# Patient Record
Sex: Male | Born: 1940 | Race: White | Hispanic: No | Marital: Single | Smoking: Former smoker
Health system: Southern US, Community
[De-identification: ages and names within clinical notes are randomized; demographics above are authoritative.]

## PROBLEM LIST (undated history)

## (undated) DIAGNOSIS — M24562 Contracture, left knee: Secondary | ICD-10-CM

## (undated) DIAGNOSIS — F32A Depression, unspecified: Secondary | ICD-10-CM

## (undated) DIAGNOSIS — Z89619 Acquired absence of unspecified leg above knee: Secondary | ICD-10-CM

## (undated) DIAGNOSIS — R1310 Dysphagia, unspecified: Secondary | ICD-10-CM

## (undated) DIAGNOSIS — I2510 Atherosclerotic heart disease of native coronary artery without angina pectoris: Secondary | ICD-10-CM

## (undated) DIAGNOSIS — J449 Chronic obstructive pulmonary disease, unspecified: Secondary | ICD-10-CM

## (undated) DIAGNOSIS — F329 Major depressive disorder, single episode, unspecified: Secondary | ICD-10-CM

## (undated) DIAGNOSIS — I10 Essential (primary) hypertension: Secondary | ICD-10-CM

## (undated) DIAGNOSIS — G8929 Other chronic pain: Secondary | ICD-10-CM

## (undated) HISTORY — PX: RENAL BIOPSY, PERCUTANEOUS: SUR144

## (undated) HISTORY — PX: AMPUTATION: SHX166

---

## 2003-01-21 ENCOUNTER — Other Ambulatory Visit: Payer: Self-pay

## 2003-01-27 ENCOUNTER — Other Ambulatory Visit: Payer: Self-pay

## 2003-05-03 ENCOUNTER — Other Ambulatory Visit: Payer: Self-pay

## 2003-09-29 ENCOUNTER — Other Ambulatory Visit: Payer: Self-pay

## 2003-10-12 ENCOUNTER — Other Ambulatory Visit: Payer: Self-pay

## 2003-12-13 ENCOUNTER — Emergency Department: Payer: Self-pay | Admitting: Emergency Medicine

## 2004-03-18 ENCOUNTER — Emergency Department: Payer: Self-pay | Admitting: Emergency Medicine

## 2004-05-15 ENCOUNTER — Ambulatory Visit: Payer: Self-pay | Admitting: Family Medicine

## 2004-05-15 ENCOUNTER — Inpatient Hospital Stay: Payer: Self-pay | Admitting: Internal Medicine

## 2004-06-10 ENCOUNTER — Inpatient Hospital Stay: Payer: Self-pay

## 2004-08-20 ENCOUNTER — Inpatient Hospital Stay: Payer: Self-pay | Admitting: Internal Medicine

## 2004-10-24 ENCOUNTER — Inpatient Hospital Stay: Payer: Self-pay | Admitting: Infectious Diseases

## 2004-10-26 ENCOUNTER — Other Ambulatory Visit: Payer: Self-pay

## 2004-12-15 ENCOUNTER — Emergency Department: Payer: Self-pay | Admitting: Emergency Medicine

## 2005-01-07 ENCOUNTER — Inpatient Hospital Stay: Payer: Self-pay | Admitting: Internal Medicine

## 2005-01-07 ENCOUNTER — Other Ambulatory Visit: Payer: Self-pay

## 2005-08-15 ENCOUNTER — Ambulatory Visit: Payer: Self-pay | Admitting: Ophthalmology

## 2005-11-21 ENCOUNTER — Ambulatory Visit: Payer: Self-pay | Admitting: Ophthalmology

## 2006-12-22 ENCOUNTER — Emergency Department: Payer: Self-pay | Admitting: Internal Medicine

## 2006-12-22 ENCOUNTER — Other Ambulatory Visit: Payer: Self-pay

## 2008-06-15 ENCOUNTER — Ambulatory Visit: Payer: Self-pay | Admitting: Internal Medicine

## 2008-07-13 ENCOUNTER — Ambulatory Visit: Payer: Self-pay | Admitting: Unknown Physician Specialty

## 2009-01-25 ENCOUNTER — Ambulatory Visit: Payer: Self-pay | Admitting: Internal Medicine

## 2009-09-28 ENCOUNTER — Emergency Department: Payer: Self-pay | Admitting: Emergency Medicine

## 2010-02-06 ENCOUNTER — Emergency Department: Payer: Self-pay | Admitting: Emergency Medicine

## 2010-03-18 ENCOUNTER — Inpatient Hospital Stay: Payer: Self-pay | Admitting: Vascular Surgery

## 2010-04-21 ENCOUNTER — Other Ambulatory Visit: Payer: Self-pay | Admitting: Geriatric Medicine

## 2011-01-19 ENCOUNTER — Ambulatory Visit: Payer: Self-pay | Admitting: Geriatric Medicine

## 2011-02-16 ENCOUNTER — Other Ambulatory Visit: Payer: Self-pay | Admitting: Geriatric Medicine

## 2011-03-02 ENCOUNTER — Other Ambulatory Visit: Payer: Self-pay | Admitting: Geriatric Medicine

## 2011-03-04 ENCOUNTER — Ambulatory Visit: Payer: Self-pay

## 2011-03-04 ENCOUNTER — Ambulatory Visit: Payer: Self-pay | Admitting: Geriatric Medicine

## 2011-03-09 ENCOUNTER — Emergency Department: Payer: Self-pay | Admitting: Unknown Physician Specialty

## 2011-03-09 LAB — CBC
HCT: 43.6 % (ref 40.0–52.0)
HGB: 14.5 g/dL (ref 13.0–18.0)
MCH: 29.8 pg (ref 26.0–34.0)
Platelet: 209 10*3/uL (ref 150–440)
RBC: 4.85 10*6/uL (ref 4.40–5.90)
RDW: 14.7 % — ABNORMAL HIGH (ref 11.5–14.5)

## 2011-03-09 LAB — COMPREHENSIVE METABOLIC PANEL
Albumin: 2.9 g/dL — ABNORMAL LOW (ref 3.4–5.0)
Alkaline Phosphatase: 55 U/L (ref 50–136)
Anion Gap: 8 (ref 7–16)
Calcium, Total: 8.7 mg/dL (ref 8.5–10.1)
Chloride: 105 mmol/L (ref 98–107)
Co2: 27 mmol/L (ref 21–32)
Creatinine: 0.86 mg/dL (ref 0.60–1.30)
EGFR (African American): >60
EGFR (Non-African Amer.): >60
Osmolality: 284 (ref 275–301)
Potassium: 4.7 mmol/L (ref 3.5–5.1)
Sodium: 140 mmol/L (ref 136–145)

## 2011-03-09 LAB — LIPASE, BLOOD: Lipase: 159 U/L (ref 73–393)

## 2011-03-09 LAB — TROPONIN I: Troponin-I: <0.02 ng/mL

## 2011-04-24 ENCOUNTER — Ambulatory Visit: Payer: Self-pay | Admitting: Gastroenterology

## 2011-05-03 ENCOUNTER — Ambulatory Visit: Payer: Self-pay | Admitting: Gastroenterology

## 2011-05-24 ENCOUNTER — Ambulatory Visit: Payer: Self-pay | Admitting: Gastroenterology

## 2011-05-28 LAB — PATHOLOGY REPORT

## 2011-10-02 ENCOUNTER — Ambulatory Visit: Payer: Self-pay | Admitting: Geriatric Medicine

## 2011-10-25 ENCOUNTER — Ambulatory Visit: Payer: Self-pay | Admitting: Urology

## 2011-11-01 ENCOUNTER — Ambulatory Visit: Payer: Self-pay | Admitting: Urology

## 2011-11-01 LAB — APTT: Activated PTT: 33.7 secs (ref 23.6–35.9)

## 2011-11-01 LAB — PROTIME-INR: INR: 0.9

## 2012-02-22 ENCOUNTER — Other Ambulatory Visit: Payer: Self-pay | Admitting: Pediatrics

## 2012-02-22 LAB — CBC WITH DIFFERENTIAL/PLATELET
Basophil #: 0.1 10*3/uL (ref 0.0–0.1)
Eosinophil #: 0.3 10*3/uL (ref 0.0–0.7)
Lymphocyte #: 1.6 10*3/uL (ref 1.0–3.6)
Lymphocyte %: 23.6 %
MCHC: 32.5 g/dL (ref 32.0–36.0)
MCV: 84 fL (ref 80–100)
Monocyte #: 0.9 x10 3/mm (ref 0.2–1.0)
Monocyte %: 13.1 %
Neutrophil #: 3.9 10*3/uL (ref 1.4–6.5)
Neutrophil %: 57.8 %
Platelet: 188 10*3/uL (ref 150–440)
RDW: 15.7 % — ABNORMAL HIGH (ref 11.5–14.5)
WBC: 6.8 10*3/uL (ref 3.8–10.6)

## 2012-02-22 LAB — BASIC METABOLIC PANEL
Anion Gap: 7 (ref 7–16)
Calcium, Total: 8.7 mg/dL (ref 8.5–10.1)
Chloride: 105 mmol/L (ref 98–107)
Co2: 26 mmol/L (ref 21–32)
EGFR (African American): >60
Glucose: 99 mg/dL (ref 65–99)
Sodium: 138 mmol/L (ref 136–145)

## 2012-04-08 ENCOUNTER — Ambulatory Visit: Payer: Self-pay | Admitting: Urology

## 2012-04-18 DIAGNOSIS — N138 Other obstructive and reflux uropathy: Secondary | ICD-10-CM | POA: Insufficient documentation

## 2012-04-18 DIAGNOSIS — N200 Calculus of kidney: Secondary | ICD-10-CM | POA: Insufficient documentation

## 2012-04-18 DIAGNOSIS — N281 Cyst of kidney, acquired: Secondary | ICD-10-CM | POA: Insufficient documentation

## 2012-04-18 DIAGNOSIS — N401 Enlarged prostate with lower urinary tract symptoms: Secondary | ICD-10-CM

## 2012-05-15 DIAGNOSIS — C649 Malignant neoplasm of unspecified kidney, except renal pelvis: Secondary | ICD-10-CM | POA: Insufficient documentation

## 2012-06-04 ENCOUNTER — Other Ambulatory Visit: Payer: Self-pay | Admitting: Family Medicine

## 2012-06-04 LAB — OCCULT BLOOD X 1 CARD TO LAB, STOOL: Occult Blood, Feces: NEGATIVE

## 2012-07-15 ENCOUNTER — Ambulatory Visit: Payer: Self-pay | Admitting: Urology

## 2012-07-15 LAB — BASIC METABOLIC PANEL
BUN: 16 mg/dL (ref 7–18)
Calcium, Total: 9.2 mg/dL (ref 8.5–10.1)
Creatinine: 0.87 mg/dL (ref 0.60–1.30)
EGFR (Non-African Amer.): >60
Glucose: 96 mg/dL (ref 65–99)
Potassium: 4.6 mmol/L (ref 3.5–5.1)

## 2012-07-15 LAB — CBC WITH DIFFERENTIAL/PLATELET
Basophil %: 0.3 %
Eosinophil #: 0.3 10*3/uL (ref 0.0–0.7)
Eosinophil %: 3.6 %
Lymphocyte %: 13.3 %
MCH: 26.6 pg (ref 26.0–34.0)
MCHC: 32.5 g/dL (ref 32.0–36.0)
MCV: 82 fL (ref 80–100)
Monocyte %: 11.8 %
Neutrophil #: 6.3 10*3/uL (ref 1.4–6.5)
RBC: 4.47 10*6/uL (ref 4.40–5.90)
RDW: 18.2 % — ABNORMAL HIGH (ref 11.5–14.5)
WBC: 8.9 10*3/uL (ref 3.8–10.6)

## 2012-07-29 ENCOUNTER — Ambulatory Visit: Payer: Self-pay | Admitting: Internal Medicine

## 2012-07-29 LAB — MAGNESIUM: Magnesium: 1.6 mg/dL — ABNORMAL LOW

## 2012-07-29 LAB — CBC WITH DIFFERENTIAL/PLATELET
Basophil #: 0.1 10*3/uL (ref 0.0–0.1)
Eosinophil %: 0.2 %
Lymphocyte #: 0.7 10*3/uL — ABNORMAL LOW (ref 1.0–3.6)
Lymphocyte %: 4 %
MCV: 82 fL (ref 80–100)
Monocyte #: 0.8 x10 3/mm (ref 0.2–1.0)
Monocyte %: 4.8 %
RBC: 4.5 10*6/uL (ref 4.40–5.90)

## 2012-07-29 LAB — BASIC METABOLIC PANEL
Calcium, Total: 8.8 mg/dL (ref 8.5–10.1)
Creatinine: 1.07 mg/dL (ref 0.60–1.30)
EGFR (African American): >60
Sodium: 132 mmol/L — ABNORMAL LOW (ref 136–145)

## 2012-07-30 LAB — CBC WITH DIFFERENTIAL/PLATELET
Basophil #: 0.1 10*3/uL (ref 0.0–0.1)
Basophil %: 0.6 %
Eosinophil %: 0.4 %
HGB: 10.5 g/dL — ABNORMAL LOW (ref 13.0–18.0)
Lymphocyte %: 11.8 %
MCH: 27.3 pg (ref 26.0–34.0)
MCHC: 33.4 g/dL (ref 32.0–36.0)
MCV: 82 fL (ref 80–100)
Monocyte #: 0.8 x10 3/mm (ref 0.2–1.0)
Monocyte %: 9.5 %
Neutrophil #: 6.6 10*3/uL — ABNORMAL HIGH (ref 1.4–6.5)
Platelet: 229 10*3/uL (ref 150–440)
RBC: 3.86 10*6/uL — ABNORMAL LOW (ref 4.40–5.90)

## 2012-07-30 LAB — BASIC METABOLIC PANEL
Anion Gap: 7 (ref 7–16)
BUN: 20 mg/dL — ABNORMAL HIGH (ref 7–18)
EGFR (Non-African Amer.): >60

## 2012-08-27 ENCOUNTER — Inpatient Hospital Stay: Payer: Self-pay | Admitting: Internal Medicine

## 2012-08-27 LAB — COMPREHENSIVE METABOLIC PANEL
Albumin: 3 g/dL — ABNORMAL LOW (ref 3.4–5.0)
Alkaline Phosphatase: 100 U/L (ref 50–136)
Anion Gap: 13 (ref 7–16)
BUN: 16 mg/dL (ref 7–18)
Bilirubin,Total: 0.5 mg/dL (ref 0.2–1.0)
Calcium, Total: 9.2 mg/dL (ref 8.5–10.1)
Chloride: 99 mmol/L (ref 98–107)
Co2: 24 mmol/L (ref 21–32)
EGFR (African American): 38 — ABNORMAL LOW
Osmolality: 273 (ref 275–301)
SGPT (ALT): 11 U/L — ABNORMAL LOW (ref 12–78)
Sodium: 136 mmol/L (ref 136–145)
Total Protein: 8 g/dL (ref 6.4–8.2)

## 2012-08-27 LAB — TROPONIN I: Troponin-I: <0.02 ng/mL

## 2012-08-27 LAB — CBC WITH DIFFERENTIAL/PLATELET
Basophil %: 0.8 %
Eosinophil %: 0.1 %
HCT: 38 % — ABNORMAL LOW (ref 40.0–52.0)
Lymphocyte #: 0.3 10*3/uL — ABNORMAL LOW (ref 1.0–3.6)
Lymphocyte %: 3 %
MCH: 28 pg (ref 26.0–34.0)
MCV: 85 fL (ref 80–100)
Monocyte #: 0.3 x10 3/mm (ref 0.2–1.0)
Monocyte %: 2.4 %
Neutrophil %: 93.7 %
WBC: 10.4 10*3/uL (ref 3.8–10.6)

## 2012-08-27 LAB — PRO B NATRIURETIC PEPTIDE: B-Type Natriuretic Peptide: 1499 pg/mL — ABNORMAL HIGH (ref 0–125)

## 2012-08-28 LAB — BASIC METABOLIC PANEL
Anion Gap: 11 (ref 7–16)
BUN: 28 mg/dL — ABNORMAL HIGH (ref 7–18)
Calcium, Total: 8.9 mg/dL (ref 8.5–10.1)
Chloride: 98 mmol/L (ref 98–107)
EGFR (African American): 47 — ABNORMAL LOW
Glucose: 101 mg/dL — ABNORMAL HIGH (ref 65–99)
Potassium: 5.3 mmol/L — ABNORMAL HIGH (ref 3.5–5.1)
Sodium: 132 mmol/L — ABNORMAL LOW (ref 136–145)

## 2012-08-29 LAB — BASIC METABOLIC PANEL
Anion Gap: 9 (ref 7–16)
BUN: 25 mg/dL — ABNORMAL HIGH (ref 7–18)
Calcium, Total: 8.6 mg/dL (ref 8.5–10.1)
Chloride: 105 mmol/L (ref 98–107)
Creatinine: 1.17 mg/dL (ref 0.60–1.30)
EGFR (Non-African Amer.): >60
Glucose: 202 mg/dL — ABNORMAL HIGH (ref 65–99)
Osmolality: 286 (ref 275–301)
Potassium: 3.4 mmol/L — ABNORMAL LOW (ref 3.5–5.1)

## 2012-08-30 LAB — BASIC METABOLIC PANEL
Anion Gap: 10 (ref 7–16)
Calcium, Total: 8.4 mg/dL — ABNORMAL LOW (ref 8.5–10.1)
Chloride: 102 mmol/L (ref 98–107)
Co2: 26 mmol/L (ref 21–32)
Creatinine: 1.12 mg/dL (ref 0.60–1.30)
EGFR (African American): >60
EGFR (Non-African Amer.): >60
Glucose: 120 mg/dL — ABNORMAL HIGH (ref 65–99)
Osmolality: 279 (ref 275–301)
Potassium: 3.3 mmol/L — ABNORMAL LOW (ref 3.5–5.1)
Sodium: 138 mmol/L (ref 136–145)

## 2012-08-30 LAB — MAGNESIUM: Magnesium: 1.5 mg/dL — ABNORMAL LOW

## 2012-09-02 LAB — CULTURE, BLOOD (SINGLE)

## 2012-12-31 ENCOUNTER — Other Ambulatory Visit: Payer: Self-pay | Admitting: Family Medicine

## 2012-12-31 LAB — BASIC METABOLIC PANEL
Anion Gap: 9 (ref 7–16)
Calcium, Total: 9.1 mg/dL (ref 8.5–10.1)
Chloride: 99 mmol/L (ref 98–107)
Co2: 25 mmol/L (ref 21–32)
EGFR (African American): 49 — ABNORMAL LOW
EGFR (Non-African Amer.): 42 — ABNORMAL LOW
Glucose: 143 mg/dL — ABNORMAL HIGH (ref 65–99)
Osmolality: 273 (ref 275–301)
Potassium: 4.6 mmol/L (ref 3.5–5.1)
Sodium: 133 mmol/L — ABNORMAL LOW (ref 136–145)

## 2012-12-31 LAB — CBC WITH DIFFERENTIAL/PLATELET
Basophil #: 0.1 10*3/uL (ref 0.0–0.1)
Eosinophil #: 0.1 10*3/uL (ref 0.0–0.7)
HGB: 11.3 g/dL — ABNORMAL LOW (ref 13.0–18.0)
Lymphocyte %: 5.6 %
MCH: 25.5 pg — ABNORMAL LOW (ref 26.0–34.0)
Monocyte #: 0.5 x10 3/mm (ref 0.2–1.0)
Monocyte %: 4 %
Neutrophil #: 10.2 10*3/uL — ABNORMAL HIGH (ref 1.4–6.5)
Neutrophil %: 89.5 %
RBC: 4.42 10*6/uL (ref 4.40–5.90)
WBC: 11.4 10*3/uL — ABNORMAL HIGH (ref 3.8–10.6)

## 2013-01-15 ENCOUNTER — Ambulatory Visit: Payer: Self-pay | Admitting: Family Medicine

## 2013-01-19 ENCOUNTER — Inpatient Hospital Stay: Payer: Self-pay | Admitting: Internal Medicine

## 2013-01-19 LAB — CK TOTAL AND CKMB (NOT AT ARMC): CK-MB: <0.5 ng/mL — ABNORMAL LOW (ref 0.5–3.6)

## 2013-01-19 LAB — CBC WITH DIFFERENTIAL/PLATELET
Basophil #: 0 10*3/uL (ref 0.0–0.1)
Basophil %: 0.1 %
Lymphocyte %: 6.5 %
MCH: 25.6 pg — ABNORMAL LOW (ref 26.0–34.0)
MCHC: 32.7 g/dL (ref 32.0–36.0)
MCV: 78 fL — ABNORMAL LOW (ref 80–100)
Monocyte %: 6.8 %
Neutrophil #: 9.5 10*3/uL — ABNORMAL HIGH (ref 1.4–6.5)
Neutrophil %: 86.3 %
Platelet: 117 10*3/uL — ABNORMAL LOW (ref 150–440)
RBC: 4.2 10*6/uL — ABNORMAL LOW (ref 4.40–5.90)
RDW: 19.9 % — ABNORMAL HIGH (ref 11.5–14.5)
WBC: 11 10*3/uL — ABNORMAL HIGH (ref 3.8–10.6)

## 2013-01-19 LAB — COMPREHENSIVE METABOLIC PANEL
Alkaline Phosphatase: 113 U/L (ref 50–136)
Anion Gap: 7 (ref 7–16)
Bilirubin,Total: 0.7 mg/dL (ref 0.2–1.0)
Chloride: 101 mmol/L (ref 98–107)
Co2: 24 mmol/L (ref 21–32)
Creatinine: 1.69 mg/dL — ABNORMAL HIGH (ref 0.60–1.30)
EGFR (African American): 47 — ABNORMAL LOW
SGOT(AST): 19 U/L (ref 15–37)
Sodium: 132 mmol/L — ABNORMAL LOW (ref 136–145)

## 2013-01-19 LAB — PRO B NATRIURETIC PEPTIDE: B-Type Natriuretic Peptide: 3905 pg/mL — ABNORMAL HIGH (ref 0–125)

## 2013-01-19 LAB — TROPONIN I: Troponin-I: <0.02 ng/mL

## 2013-01-20 LAB — CBC WITH DIFFERENTIAL/PLATELET
Basophil #: 0 10*3/uL (ref 0.0–0.1)
Basophil %: 0 %
Lymphocyte #: 0.4 10*3/uL — ABNORMAL LOW (ref 1.0–3.6)
MCH: 25.8 pg — ABNORMAL LOW (ref 26.0–34.0)
MCHC: 33.1 g/dL (ref 32.0–36.0)
Monocyte #: 0.2 x10 3/mm (ref 0.2–1.0)
Monocyte %: 2.6 %
Neutrophil #: 5.6 10*3/uL (ref 1.4–6.5)
Neutrophil %: 90.8 %
RDW: 19.8 % — ABNORMAL HIGH (ref 11.5–14.5)
WBC: 6.1 10*3/uL (ref 3.8–10.6)

## 2013-01-20 LAB — BASIC METABOLIC PANEL
Calcium, Total: 9.2 mg/dL (ref 8.5–10.1)
Chloride: 101 mmol/L (ref 98–107)
Co2: 24 mmol/L (ref 21–32)
EGFR (African American): 53 — ABNORMAL LOW
EGFR (Non-African Amer.): 46 — ABNORMAL LOW
Glucose: 137 mg/dL — ABNORMAL HIGH (ref 65–99)
Potassium: 4.3 mmol/L (ref 3.5–5.1)
Sodium: 132 mmol/L — ABNORMAL LOW (ref 136–145)

## 2013-02-12 ENCOUNTER — Ambulatory Visit: Payer: Self-pay | Admitting: Gastroenterology

## 2013-03-11 LAB — PATHOLOGY REPORT

## 2013-06-17 ENCOUNTER — Other Ambulatory Visit: Payer: Self-pay | Admitting: Family Medicine

## 2013-06-17 LAB — BASIC METABOLIC PANEL
Anion Gap: 7 (ref 7–16)
BUN: 17 mg/dL (ref 7–18)
CHLORIDE: 105 mmol/L (ref 98–107)
Calcium, Total: 8.7 mg/dL (ref 8.5–10.1)
Co2: 26 mmol/L (ref 21–32)
Creatinine: 0.88 mg/dL (ref 0.60–1.30)
EGFR (Non-African Amer.): >60
Glucose: 105 mg/dL — ABNORMAL HIGH (ref 65–99)
Osmolality: 278 (ref 275–301)
POTASSIUM: 4.4 mmol/L (ref 3.5–5.1)
SODIUM: 138 mmol/L (ref 136–145)

## 2013-06-17 LAB — CBC WITH DIFFERENTIAL/PLATELET
BASOS ABS: 0.1 10*3/uL (ref 0.0–0.1)
Basophil %: 0.9 %
EOS PCT: 3.2 %
Eosinophil #: 0.3 10*3/uL (ref 0.0–0.7)
HCT: 40.6 % (ref 40.0–52.0)
HGB: 12.5 g/dL — AB (ref 13.0–18.0)
LYMPHS ABS: 1.1 10*3/uL (ref 1.0–3.6)
Lymphocyte %: 11.7 %
MCH: 28.7 pg (ref 26.0–34.0)
MCHC: 30.8 g/dL — ABNORMAL LOW (ref 32.0–36.0)
MCV: 93 fL (ref 80–100)
MONO ABS: 0.7 x10 3/mm (ref 0.2–1.0)
MONOS PCT: 7.9 %
NEUTROS ABS: 7.1 10*3/uL — AB (ref 1.4–6.5)
NEUTROS PCT: 76.3 %
Platelet: 259 10*3/uL (ref 150–440)
RBC: 4.35 10*6/uL — ABNORMAL LOW (ref 4.40–5.90)
RDW: 15.1 % — ABNORMAL HIGH (ref 11.5–14.5)
WBC: 9.3 10*3/uL (ref 3.8–10.6)

## 2013-08-07 ENCOUNTER — Other Ambulatory Visit: Payer: Self-pay

## 2013-08-07 LAB — BASIC METABOLIC PANEL
ANION GAP: 7 (ref 7–16)
BUN: 24 mg/dL — AB (ref 7–18)
CALCIUM: 9 mg/dL (ref 8.5–10.1)
CHLORIDE: 104 mmol/L (ref 98–107)
Co2: 26 mmol/L (ref 21–32)
Creatinine: 1.26 mg/dL (ref 0.60–1.30)
EGFR (African American): >60
EGFR (Non-African Amer.): 57 — ABNORMAL LOW
Glucose: 100 mg/dL — ABNORMAL HIGH (ref 65–99)
Osmolality: 278 (ref 275–301)
Potassium: 4.5 mmol/L (ref 3.5–5.1)
SODIUM: 137 mmol/L (ref 136–145)

## 2013-08-07 LAB — CBC WITH DIFFERENTIAL/PLATELET
BASOS PCT: 1.3 %
Basophil #: 0.1 10*3/uL (ref 0.0–0.1)
EOS ABS: 0.3 10*3/uL (ref 0.0–0.7)
EOS PCT: 2.7 %
HCT: 40.4 % (ref 40.0–52.0)
HGB: 13.3 g/dL (ref 13.0–18.0)
Lymphocyte #: 1.3 10*3/uL (ref 1.0–3.6)
Lymphocyte %: 13.6 %
MCH: 31 pg (ref 26.0–34.0)
MCHC: 33 g/dL (ref 32.0–36.0)
MCV: 94 fL (ref 80–100)
MONOS PCT: 6.4 %
Monocyte #: 0.6 x10 3/mm (ref 0.2–1.0)
NEUTROS ABS: 7.4 10*3/uL — AB (ref 1.4–6.5)
Neutrophil %: 76 %
PLATELETS: 228 10*3/uL (ref 150–440)
RBC: 4.3 10*6/uL — AB (ref 4.40–5.90)
RDW: 15.2 % — ABNORMAL HIGH (ref 11.5–14.5)
WBC: 9.7 10*3/uL (ref 3.8–10.6)

## 2013-08-21 ENCOUNTER — Other Ambulatory Visit: Payer: Self-pay

## 2013-08-21 LAB — CBC WITH DIFFERENTIAL/PLATELET
Basophil #: 0.1 10*3/uL (ref 0.0–0.1)
Basophil %: 1 %
EOS ABS: 0.3 10*3/uL (ref 0.0–0.7)
EOS PCT: 3.4 %
HCT: 39.4 % — ABNORMAL LOW (ref 40.0–52.0)
HGB: 13 g/dL (ref 13.0–18.0)
LYMPHS ABS: 1.1 10*3/uL (ref 1.0–3.6)
LYMPHS PCT: 12.6 %
MCH: 31.1 pg (ref 26.0–34.0)
MCHC: 33.1 g/dL (ref 32.0–36.0)
MCV: 94 fL (ref 80–100)
Monocyte #: 0.6 x10 3/mm (ref 0.2–1.0)
Monocyte %: 6.2 %
Neutrophil #: 6.9 10*3/uL — ABNORMAL HIGH (ref 1.4–6.5)
Neutrophil %: 76.8 %
Platelet: 262 10*3/uL (ref 150–440)
RBC: 4.19 10*6/uL — AB (ref 4.40–5.90)
RDW: 15.5 % — ABNORMAL HIGH (ref 11.5–14.5)
WBC: 9 10*3/uL (ref 3.8–10.6)

## 2013-08-21 LAB — BASIC METABOLIC PANEL
Anion Gap: 8 (ref 7–16)
BUN: 22 mg/dL — AB (ref 7–18)
Calcium, Total: 9 mg/dL (ref 8.5–10.1)
Chloride: 102 mmol/L (ref 98–107)
Co2: 25 mmol/L (ref 21–32)
Creatinine: 1.03 mg/dL (ref 0.60–1.30)
EGFR (African American): >60
EGFR (Non-African Amer.): >60
Glucose: 142 mg/dL — ABNORMAL HIGH (ref 65–99)
OSMOLALITY: 276 (ref 275–301)
Potassium: 4.7 mmol/L (ref 3.5–5.1)
Sodium: 135 mmol/L — ABNORMAL LOW (ref 136–145)

## 2013-12-09 ENCOUNTER — Ambulatory Visit: Payer: Self-pay | Admitting: Family Medicine

## 2013-12-09 LAB — CBC WITH DIFFERENTIAL/PLATELET
BASOS ABS: 0 10*3/uL (ref 0.0–0.1)
Basophil %: 0.3 %
Eosinophil #: 0 10*3/uL (ref 0.0–0.7)
Eosinophil %: 0.2 %
HCT: 37 % — ABNORMAL LOW (ref 40.0–52.0)
HGB: 11.6 g/dL — ABNORMAL LOW (ref 13.0–18.0)
LYMPHS ABS: 0.6 10*3/uL — AB (ref 1.0–3.6)
Lymphocyte %: 4.3 %
MCH: 29.5 pg (ref 26.0–34.0)
MCHC: 31.4 g/dL — ABNORMAL LOW (ref 32.0–36.0)
MCV: 94 fL (ref 80–100)
MONO ABS: 0.7 x10 3/mm (ref 0.2–1.0)
Monocyte %: 5.4 %
NEUTROS ABS: 12 10*3/uL — AB (ref 1.4–6.5)
NEUTROS PCT: 89.8 %
PLATELETS: 224 10*3/uL (ref 150–440)
RBC: 3.93 10*6/uL — ABNORMAL LOW (ref 4.40–5.90)
RDW: 16.8 % — AB (ref 11.5–14.5)
WBC: 13.4 10*3/uL — ABNORMAL HIGH (ref 3.8–10.6)

## 2013-12-09 LAB — BASIC METABOLIC PANEL
Anion Gap: 10 (ref 7–16)
BUN: 29 mg/dL — AB (ref 7–18)
CALCIUM: 8.7 mg/dL (ref 8.5–10.1)
CHLORIDE: 103 mmol/L (ref 98–107)
CREATININE: 1.49 mg/dL — AB (ref 0.60–1.30)
Co2: 25 mmol/L (ref 21–32)
GFR CALC NON AF AMER: 50 — AB
Glucose: 78 mg/dL (ref 65–99)
Osmolality: 280 (ref 275–301)
Potassium: 4.9 mmol/L (ref 3.5–5.1)
SODIUM: 138 mmol/L (ref 136–145)

## 2014-02-19 ENCOUNTER — Ambulatory Visit: Payer: Self-pay

## 2014-02-19 LAB — CBC WITH DIFFERENTIAL/PLATELET
BASOS PCT: 1.1 %
Basophil #: 0.1 10*3/uL (ref 0.0–0.1)
Eosinophil #: 0.2 10*3/uL (ref 0.0–0.7)
Eosinophil %: 2.2 %
HCT: 33.3 % — ABNORMAL LOW (ref 40.0–52.0)
HGB: 10.6 g/dL — AB (ref 13.0–18.0)
LYMPHS ABS: 2.4 10*3/uL (ref 1.0–3.6)
LYMPHS PCT: 22.8 %
MCH: 28.5 pg (ref 26.0–34.0)
MCHC: 31.7 g/dL — AB (ref 32.0–36.0)
MCV: 90 fL (ref 80–100)
MONO ABS: 0.3 x10 3/mm (ref 0.2–1.0)
Monocyte %: 3 %
Neutrophil #: 7.6 10*3/uL — ABNORMAL HIGH (ref 1.4–6.5)
Neutrophil %: 70.9 %
Platelet: 263 10*3/uL (ref 150–440)
RBC: 3.7 10*6/uL — AB (ref 4.40–5.90)
RDW: 16.8 % — ABNORMAL HIGH (ref 11.5–14.5)
WBC: 10.7 10*3/uL — ABNORMAL HIGH (ref 3.8–10.6)

## 2014-02-22 ENCOUNTER — Ambulatory Visit: Payer: Self-pay

## 2014-02-22 ENCOUNTER — Inpatient Hospital Stay: Payer: Self-pay | Admitting: Surgery

## 2014-02-22 LAB — BASIC METABOLIC PANEL
ANION GAP: 10 (ref 7–16)
BUN: 18 mg/dL (ref 7–18)
Calcium, Total: 7.9 mg/dL — ABNORMAL LOW (ref 8.5–10.1)
Chloride: 99 mmol/L (ref 98–107)
Co2: 25 mmol/L (ref 21–32)
Creatinine: 0.97 mg/dL (ref 0.60–1.30)
EGFR (African American): >60
Glucose: 109 mg/dL — ABNORMAL HIGH (ref 65–99)
Osmolality: 271 (ref 275–301)
Potassium: 4.2 mmol/L (ref 3.5–5.1)
SODIUM: 134 mmol/L — AB (ref 136–145)

## 2014-02-22 LAB — URINALYSIS, COMPLETE
Bacteria: NONE SEEN
Bilirubin,UR: NEGATIVE
Blood: NEGATIVE
GLUCOSE, UR: NEGATIVE mg/dL (ref 0–75)
KETONE: NEGATIVE
Leukocyte Esterase: NEGATIVE
Nitrite: NEGATIVE
Ph: 5 (ref 4.5–8.0)
Protein: 30
Specific Gravity: 1.026 (ref 1.003–1.030)
Squamous Epithelial: <1

## 2014-02-22 LAB — HEPATIC FUNCTION PANEL A (ARMC)
ALK PHOS: 102 U/L
Albumin: 2 g/dL — ABNORMAL LOW (ref 3.4–5.0)
BILIRUBIN TOTAL: 0.5 mg/dL (ref 0.2–1.0)
Bilirubin, Direct: 0.1 mg/dL (ref 0.0–0.2)
SGOT(AST): 11 U/L — ABNORMAL LOW (ref 15–37)
SGPT (ALT): 11 U/L — ABNORMAL LOW
Total Protein: 6.8 g/dL (ref 6.4–8.2)

## 2014-02-22 LAB — CBC WITH DIFFERENTIAL/PLATELET
BASOS ABS: 0.1 10*3/uL (ref 0.0–0.1)
Basophil %: 1.3 %
EOS ABS: 0 10*3/uL (ref 0.0–0.7)
EOS PCT: 0.4 %
HCT: 30 % — ABNORMAL LOW (ref 40.0–52.0)
HGB: 9.8 g/dL — ABNORMAL LOW (ref 13.0–18.0)
LYMPHS PCT: 9.4 %
Lymphocyte #: 0.9 10*3/uL — ABNORMAL LOW (ref 1.0–3.6)
MCH: 28.6 pg (ref 26.0–34.0)
MCHC: 32.7 g/dL (ref 32.0–36.0)
MCV: 88 fL (ref 80–100)
MONO ABS: 0.8 x10 3/mm (ref 0.2–1.0)
MONOS PCT: 7.9 %
NEUTROS ABS: 7.8 10*3/uL — AB (ref 1.4–6.5)
NEUTROS PCT: 81 %
Platelet: 288 10*3/uL (ref 150–440)
RBC: 3.43 10*6/uL — ABNORMAL LOW (ref 4.40–5.90)
RDW: 17.1 % — ABNORMAL HIGH (ref 11.5–14.5)
WBC: 9.7 10*3/uL (ref 3.8–10.6)

## 2014-02-22 LAB — AMYLASE: AMYLASE: 23 U/L — AB (ref 25–115)

## 2014-02-22 LAB — LIPASE, BLOOD: Lipase: 71 U/L — ABNORMAL LOW (ref 73–393)

## 2014-02-23 LAB — CBC WITH DIFFERENTIAL/PLATELET
BASOS ABS: 0.1 10*3/uL (ref 0.0–0.1)
BASOS PCT: 1 %
EOS PCT: 1.1 %
Eosinophil #: 0.1 10*3/uL (ref 0.0–0.7)
HCT: 28.8 % — ABNORMAL LOW (ref 40.0–52.0)
HGB: 9.3 g/dL — ABNORMAL LOW (ref 13.0–18.0)
LYMPHS ABS: 0.8 10*3/uL — AB (ref 1.0–3.6)
Lymphocyte %: 9.6 %
MCH: 28.7 pg (ref 26.0–34.0)
MCHC: 32.5 g/dL (ref 32.0–36.0)
MCV: 88 fL (ref 80–100)
Monocyte #: 0.8 x10 3/mm (ref 0.2–1.0)
Monocyte %: 9.3 %
NEUTROS ABS: 6.7 10*3/uL — AB (ref 1.4–6.5)
Neutrophil %: 79 %
PLATELETS: 294 10*3/uL (ref 150–440)
RBC: 3.26 10*6/uL — ABNORMAL LOW (ref 4.40–5.90)
RDW: 17.1 % — ABNORMAL HIGH (ref 11.5–14.5)
WBC: 8.5 10*3/uL (ref 3.8–10.6)

## 2014-02-23 LAB — BASIC METABOLIC PANEL
Anion Gap: 9 (ref 7–16)
BUN: 15 mg/dL (ref 7–18)
CALCIUM: 8.4 mg/dL — AB (ref 8.5–10.1)
CO2: 24 mmol/L (ref 21–32)
Chloride: 97 mmol/L — ABNORMAL LOW (ref 98–107)
Creatinine: 0.98 mg/dL (ref 0.60–1.30)
Glucose: 75 mg/dL (ref 65–99)
Osmolality: 260 (ref 275–301)
POTASSIUM: 4 mmol/L (ref 3.5–5.1)
Sodium: 130 mmol/L — ABNORMAL LOW (ref 136–145)

## 2014-02-23 LAB — TSH: Thyroid Stimulating Horm: 2.78 u[IU]/mL

## 2014-02-23 LAB — HEPATIC FUNCTION PANEL A (ARMC)
ALT: 9 U/L — AB
Albumin: 1.8 g/dL — ABNORMAL LOW (ref 3.4–5.0)
Alkaline Phosphatase: 101 U/L
BILIRUBIN DIRECT: 0.2 mg/dL (ref 0.0–0.2)
Bilirubin,Total: 0.5 mg/dL (ref 0.2–1.0)
SGOT(AST): 22 U/L (ref 15–37)
Total Protein: 6.8 g/dL (ref 6.4–8.2)

## 2014-02-23 LAB — T4, FREE: Free Thyroxine: 1.5 ng/dL — ABNORMAL HIGH (ref 0.76–1.46)

## 2014-02-24 LAB — CBC WITH DIFFERENTIAL/PLATELET
Basophil #: 0.1 10*3/uL (ref 0.0–0.1)
Basophil %: 0.8 %
EOS ABS: 0 10*3/uL (ref 0.0–0.7)
Eosinophil %: 0.6 %
HCT: 29.3 % — AB (ref 40.0–52.0)
HGB: 9.3 g/dL — AB (ref 13.0–18.0)
LYMPHS PCT: 6.5 %
Lymphocyte #: 0.5 10*3/uL — ABNORMAL LOW (ref 1.0–3.6)
MCH: 27.9 pg (ref 26.0–34.0)
MCHC: 31.7 g/dL — AB (ref 32.0–36.0)
MCV: 88 fL (ref 80–100)
MONO ABS: 0.7 x10 3/mm (ref 0.2–1.0)
Monocyte %: 9.7 %
Neutrophil #: 6.2 10*3/uL (ref 1.4–6.5)
Neutrophil %: 82.4 %
PLATELETS: 292 10*3/uL (ref 150–440)
RBC: 3.33 10*6/uL — ABNORMAL LOW (ref 4.40–5.90)
RDW: 17.2 % — ABNORMAL HIGH (ref 11.5–14.5)
WBC: 7.6 10*3/uL (ref 3.8–10.6)

## 2014-02-24 LAB — BASIC METABOLIC PANEL
ANION GAP: 12 (ref 7–16)
BUN: 9 mg/dL (ref 7–18)
CALCIUM: 8.3 mg/dL — AB (ref 8.5–10.1)
CHLORIDE: 105 mmol/L (ref 98–107)
CO2: 16 mmol/L — AB (ref 21–32)
CREATININE: 1.03 mg/dL (ref 0.60–1.30)
EGFR (Non-African Amer.): >60
GLUCOSE: 91 mg/dL (ref 65–99)
Osmolality: 265 (ref 275–301)
Potassium: 4.2 mmol/L (ref 3.5–5.1)
Sodium: 133 mmol/L — ABNORMAL LOW (ref 136–145)

## 2014-02-24 LAB — URINE CULTURE

## 2014-02-25 LAB — URINALYSIS, COMPLETE
Bilirubin,UR: NEGATIVE
Glucose,UR: NEGATIVE mg/dL (ref 0–75)
Ketone: NEGATIVE
LEUKOCYTE ESTERASE: NEGATIVE
Nitrite: NEGATIVE
PH: 6 (ref 4.5–8.0)
Protein: 25
RBC,UR: 27 /HPF (ref 0–5)
Specific Gravity: 1.03 (ref 1.003–1.030)
Squamous Epithelial: 1
WBC UR: 3 /HPF (ref 0–5)

## 2014-02-26 LAB — BASIC METABOLIC PANEL
Anion Gap: 9 (ref 7–16)
BUN: 13 mg/dL (ref 7–18)
CHLORIDE: 110 mmol/L — AB (ref 98–107)
CO2: 22 mmol/L (ref 21–32)
CREATININE: 0.99 mg/dL (ref 0.60–1.30)
Calcium, Total: 8.5 mg/dL (ref 8.5–10.1)
EGFR (Non-African Amer.): >60
GLUCOSE: 132 mg/dL — AB (ref 65–99)
OSMOLALITY: 283 (ref 275–301)
POTASSIUM: 4.9 mmol/L (ref 3.5–5.1)
Sodium: 141 mmol/L (ref 136–145)

## 2014-02-26 LAB — CBC WITH DIFFERENTIAL/PLATELET
Basophil #: 0 10*3/uL (ref 0.0–0.1)
Basophil %: 0.2 %
EOS PCT: 0 %
Eosinophil #: 0 10*3/uL (ref 0.0–0.7)
HCT: 24.4 % — AB (ref 40.0–52.0)
HGB: 7.8 g/dL — ABNORMAL LOW (ref 13.0–18.0)
Lymphocyte #: 0.4 10*3/uL — ABNORMAL LOW (ref 1.0–3.6)
Lymphocyte %: 3.9 %
MCH: 28.6 pg (ref 26.0–34.0)
MCHC: 31.9 g/dL — AB (ref 32.0–36.0)
MCV: 90 fL (ref 80–100)
Monocyte #: 0.5 x10 3/mm (ref 0.2–1.0)
Monocyte %: 5.6 %
Neutrophil #: 8.5 10*3/uL — ABNORMAL HIGH (ref 1.4–6.5)
Neutrophil %: 90.3 %
Platelet: 314 10*3/uL (ref 150–440)
RBC: 2.72 10*6/uL — ABNORMAL LOW (ref 4.40–5.90)
RDW: 17.3 % — ABNORMAL HIGH (ref 11.5–14.5)
WBC: 9.4 10*3/uL (ref 3.8–10.6)

## 2014-02-27 LAB — BASIC METABOLIC PANEL
ANION GAP: 7 (ref 7–16)
BUN: 18 mg/dL (ref 7–18)
CALCIUM: 8 mg/dL — AB (ref 8.5–10.1)
Chloride: 107 mmol/L (ref 98–107)
Co2: 25 mmol/L (ref 21–32)
Creatinine: 0.95 mg/dL (ref 0.60–1.30)
EGFR (Non-African Amer.): >60
GLUCOSE: 117 mg/dL — AB (ref 65–99)
Osmolality: 280 (ref 275–301)
POTASSIUM: 4.3 mmol/L (ref 3.5–5.1)
SODIUM: 139 mmol/L (ref 136–145)

## 2014-02-27 LAB — CBC WITH DIFFERENTIAL/PLATELET
Basophil #: 0 10*3/uL (ref 0.0–0.1)
Basophil %: 0.1 %
EOS PCT: 0.1 %
Eosinophil #: 0 10*3/uL (ref 0.0–0.7)
HCT: 27.6 % — AB (ref 40.0–52.0)
HGB: 8.6 g/dL — ABNORMAL LOW (ref 13.0–18.0)
Lymphocyte #: 0.3 10*3/uL — ABNORMAL LOW (ref 1.0–3.6)
Lymphocyte %: 5.6 %
MCH: 27.9 pg (ref 26.0–34.0)
MCHC: 31.1 g/dL — ABNORMAL LOW (ref 32.0–36.0)
MCV: 90 fL (ref 80–100)
MONO ABS: 0.4 x10 3/mm (ref 0.2–1.0)
Monocyte %: 6.2 %
Neutrophil #: 5 10*3/uL (ref 1.4–6.5)
Neutrophil %: 88 %
Platelet: 347 10*3/uL (ref 150–440)
RBC: 3.07 10*6/uL — AB (ref 4.40–5.90)
RDW: 17.2 % — AB (ref 11.5–14.5)
WBC: 5.7 10*3/uL (ref 3.8–10.6)

## 2014-02-27 LAB — CULTURE, BLOOD (SINGLE)

## 2014-02-28 LAB — BASIC METABOLIC PANEL
ANION GAP: 8 (ref 7–16)
BUN: 19 mg/dL — ABNORMAL HIGH (ref 7–18)
CALCIUM: 8.6 mg/dL (ref 8.5–10.1)
Chloride: 100 mmol/L (ref 98–107)
Co2: 29 mmol/L (ref 21–32)
Creatinine: 0.96 mg/dL (ref 0.60–1.30)
EGFR (African American): >60
GLUCOSE: 120 mg/dL — AB (ref 65–99)
Osmolality: 277 (ref 275–301)
Potassium: 3.4 mmol/L — ABNORMAL LOW (ref 3.5–5.1)
Sodium: 137 mmol/L (ref 136–145)

## 2014-02-28 LAB — CBC WITH DIFFERENTIAL/PLATELET
BASOS ABS: 0 10*3/uL (ref 0.0–0.1)
BASOS PCT: 0.1 %
EOS ABS: 0 10*3/uL (ref 0.0–0.7)
EOS PCT: 0 %
HCT: 27.5 % — ABNORMAL LOW (ref 40.0–52.0)
HGB: 8.7 g/dL — ABNORMAL LOW (ref 13.0–18.0)
LYMPHS ABS: 0.3 10*3/uL — AB (ref 1.0–3.6)
LYMPHS PCT: 6.8 %
MCH: 27.7 pg (ref 26.0–34.0)
MCHC: 31.8 g/dL — AB (ref 32.0–36.0)
MCV: 87 fL (ref 80–100)
Monocyte #: 0.2 x10 3/mm (ref 0.2–1.0)
Monocyte %: 6.1 %
NEUTROS ABS: 3.5 10*3/uL (ref 1.4–6.5)
NEUTROS PCT: 87 %
PLATELETS: 388 10*3/uL (ref 150–440)
RBC: 3.15 10*6/uL — ABNORMAL LOW (ref 4.40–5.90)
RDW: 16.9 % — ABNORMAL HIGH (ref 11.5–14.5)
WBC: 4 10*3/uL (ref 3.8–10.6)

## 2014-06-25 NOTE — Discharge Summary (Signed)
PATIENT NAME:  Brent Ryan, Brent Ryan MR#:  [id number] DATE OF BIRTH:  [birth year]  DATE OF ADMISSION:  07/29/2012 DATE OF DISCHARGE:  07/30/2012   PRIMARY CARE PHYSICIAN:  Dr. Dan Humphreys  DISCHARGE DIAGNOSES: 1.  Acute respiratory failure.  2.  Chronic obstructive pulmonary disease  3 . Hypertension.  4.  Hypomagnesemia.  5.  Renal carcinoma.  PROCEDURES:  Cryotherapy.  CONDITION: Stable.   CODE STATUS: Full code.   MEDICATIONS:  As per the [facility] discharge instruction medication reconciliation list. Continue home medication.   DIET: Low sodium, low fat, low cholesterol diet.   ACTIVITY: As tolerated.   FOLLOW UP CARE:  Follow with PCP within 1 to 2 weeks. Follow up with Dr. Jacqlyn Larsen, urologist in one week.   REASON FOR ADMISSION: Respiratory distress after surgery.   HOSPITAL COURSE: The patient is a 74 year old Caucasian male with a history of COPD, CVA, seizure, hypertension and recently diagnosed with renal carcinoma and yesterday underwent cryotherapy under anesthesia. The patient was intubated during the procedure but after extubation, the patient developed respiratory distress and failure and reintubated again. Dr. Jacqlyn Larsen requested a consultation for respiratory failure. For detailed history and physical examination, please refer to the admission note dictated by me yesterday.  The patient was admitted for respiratory failure. The patient was successfully extubated 2 hours after the second intubation, but the patient is to have respiratory distress with oxygen by Ventimask. I discussed with Dr. Jacqlyn Larsen and we decided to admit the patient for observation. After admission, the patient has been treated with oxygen by nasal cannula with DuoNeb and Spiriva.  The patient's  respiratory failure had improved. The patient's oxygen saturation is above 92 without oxygen.   For hypertension the patient has been treated with Norvasc, Lopressor and aspirin.   The patient is clinically stable and will  be discharged back to a skilled nursing facility today. I discussed the patient's discharge plan with the patient, the case manager and nurse.   TIME SPENT: About sent from 76 minutes.     ____________________________ Demetrios Loll, MD qc:ct D: 07/30/2012 14:01:48 ET T: 07/30/2012 14:55:53 ET JOB#: [id number]  cc: Demetrios Loll, MD, <Dictator> Demetrios Loll MD ELECTRONICALLY SIGNED 07/30/2012 20:16

## 2014-06-25 NOTE — Discharge Summary (Signed)
PATIENT NAME:  Brent Ryan, Brent Ryan MR#:  [id number] DATE OF BIRTH:  07/30/1940  DATE OF ADMISSION:  08/27/2012 DATE OF DISCHARGE:  08/30/2012   PRESENTING COMPLAINT: Shortness of breath, cough.   DISCHARGE DIAGNOSES:  1. Acute hypoxic respiratory failure secondary to chronic obstructive pulmonary disease exacerbation in the setting of right lower lobe pneumonia and mild congestive heart failure.  2. Acute congestive heart failure, suspect diastolic, in the setting of pneumonia and chronic obstructive pulmonary disease exacerbation.  3. Acute renal failure, appeared prerenal azotemia, improved.  4. Relative hypotension, resolved.  5. History of hyperthyroidism.  6. History of deep vein thrombosis.  7. Left renal carcinoma.  8. 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 The patient is bedbound and wheelchair bound.   CODE STATUS: Full code.   DIET: 2 grams sodium, mechanical soft with pureed meats.   OXYGEN: Wean oxygen to room when sats greater than 92%. DuoNebs q.4 hours p.r.n.   MEDICATIONS:  1. Tylenol 650 q.4 p.r.n.  2. Colace 100 mg b.i.d. p.r.n.  3. Senokot 1 tablet p.o. b.i.d. p.r.n.  4. Aspirin 81 mg daily.  5. Norco 5/325 one tablet q.8 p.r.n.  6. Depakote extended release 250 mg b.i.d. with meals.  7. Methimazole 7.5 mg q.24.  8. Flomax 0.4 mg daily.  9. Zoloft 25 mg daily.  10. Lopressor 25 mg b.i.d.  11. Spiriva 1 capsule inhalation daily.  12. Ferrous sulfate 325 mg p.o. daily.  13. Omeprazole 20 mg daily.  14. Flovent HFA 1 inhalation b.i.d.  15. Guaifenesin 5 mL q.6 p.r.n. for cough.  16. Lasix 20 mg daily.  17. Amlodipine 2.5 mg daily.  18. Magnesium oxide 400 mg daily.  19. Levaquin 750 p.o. daily, to complete a 7-day course for 3 more days.  20. Albuterol 2 puffs q.4 while awake.  21. Vitamin D2 50,000 units monthly.  22. Prednisone taper as directed.   CONSULTATIONS: None.   LABORATORY DATA: At discharge, glucose is 120, BUN is 20,  creatinine is 1.1, sodium is 138, potassium 3.3, chloride 102, bicarbonate is 26, calcium is 8.4, magnesium 1.5. Blood cultures negative in 48 hours. White count is 10.4, H and H 12.6 and 38.8. Echo Doppler showed EF of 55% to 60%. Normal global left ventricular systolic function. Severely increased left ventricular posterior wall thickness. Mild MR.  BRIEF SUMMARY OF HOSPITAL COURSE: Brent Ryan is a 74 year old Caucasian gentleman who is a resident at [facility], who was admitted on June 25th with:  1. Acute hypoxic respiratory failure secondary to COPD exacerbation with right lower lobe pneumonia and mild congestive heart failure, suspect diastolic, acute. The patient was admitted on the telemetry floor, was started on IV antibiotics with Zosyn and Levaquin. He was also given nebulizers or inhalers, IV Solu-Medrol around the clock. The patient is improved, much better. He is still on oxygen, requiring about 2 to 3 liters. He has come down from 6 liters to 2 liters. Slowly wean it off at the skilled facility if sats remain greater than 92%. The patient's blood cultures remain negative. He remains afebrile.  2. Acute congestive heart failure, suspect diastolic in the setting of pneumonia and COPD exacerbation. The patient appears euvolemic. His Lasix has been resumed.  3. Relative hypotension, improved after IV fluids. Home medications, Norvasc and Lasix, have been resumed.  4. Acute renal failure, appeared prerenal azotemia, improved with some IV fluids. Creatinine is back to normal.  5. History of hyperthyroidism. Continue methimazole. 6. History of deep  vein thrombosis in the past. Heparin was given for subcutaneous prophylaxis. 7. Left renal carcinoma. The patient recently had percutaneous cryotherapy per Dr. Jacqlyn Ryan. Will continue his Flomax. The patient will follow up with Dr. Jacqlyn Ryan as outpatient.  8. The patient is wheelchair- and bedbound due to his right above-knee amputation.  [facility] stay  otherwise remained uncomplicated.   CODE STATUS: The patient remained a full code.   TIME SPENT: 40 minutes.   ____________________________ Brent Rochester Posey Pronto, MD sap:OSi D: 08/30/2012 09:44:58 ET T: 08/30/2012 09:56:50 ET JOB#: [id number]  cc: Brent Guldin A. Posey Pronto, MD, <Dictator> Brent Bors. Brent Larsen, MD Brent Basset MD ELECTRONICALLY SIGNED 09/18/2012 7:30

## 2014-06-25 NOTE — H&P (Signed)
PATIENT NAME:  Brent Ryan, Brent Ryan MR#:  [id number] DATE OF BIRTH:  August 19, 1940  DATE OF ADMISSION:  07/29/2012  PRIMARY CARE PHYSICIAN: Dr. Ivin Booty.   REFERRING PHYSICIAN:  Dr. Jacqlyn Larsen.  CHIEF COMPLAINT: Respiratory distress after surgery today.  HISTORY OF PRESENT ILLNESS: A 74 year old Caucasian male with a history of COPD, CVA, seizures, hypertension, was recently diagnosed with renal carcinoma, status cryotherapy today. The patient was under anesthesia, was intubated during the procedure. He developed respiratory failure after extubation today. His O2 saturations decreased to the 70-80s, so he was re-intubated again about 9:00 a.m. today. After breathing treatments, the patient was extubated again just now. After extubation the patient was placed on a Venti-Mask, still disoriented, possibilly due to sedation, and Dr. Jacqlyn Larsen requested a consult. I discussed him with Dr. Jacqlyn Larsen, and then we decided to admit the patient for observation.  PAST MEDICAL HISTORY: COPD, seizure disorder, hypertension, alcohol abuse, tobacco abuse, DVT, status post IVC filter, PVD, status post right BKA, right eye blindness.   FAMILY HISTORY: Unable to obtain at this time.   REVIEW OF SYSTEMS: The patient is sedated and unable to obtain at this time.    ALLERGIES: No.  MEDICATIONS:   1.  Norvasc 2.5 mg p.o. daily.  2.  Aspirin 81 mg p.o. daily.  3.  Delsym 12 hours, cough, with 10 mL b.i.d., p.r.n.   4.  Depakote ER 250 mg p.o. b.i.d.  5.  Drisdol 50,000 international units 1.25 mg capsule once a month.  6.  DuoNeb 1 dose every 6 hours p.r.n.  7.  Ferrous sulfate 325 mg p.o. daily.  8.  Flovent HFA, CFC-free, 44 mcg inhalation, 1 puff b.i.d.  9.  Methimazole 7.5 mg p.o. daily.  10.  Lopressor 25 mg p.o. b.i.d.  11. Mintox 200 mg/200 mg/20 mg per 5 mL oral suspension, 15 mL p.o. every 4 hours p.r.n. for indigestion.  12.  Omeprazole 20 mg p.o. daily.  13.  ProAir HFA 2 puffs 4 times a day.  14.  Senna S 50 mg/8.6 mg  p.o. at bedtime, p.r.n.  15.  Spiriva 18 mcg capsule 1 each inhaled once a day.  16.  Tylenol 500 mg with 2 tablets b.i.d.  17.  Zoloft 25 mg p.o. daily.  PHYSICAL EXAMINATION: VITAL SIGNS: Blood pressure 142/107, pulse 67, oxygen saturation 97% on oxygen, respirations 23.  GENERAL: The patient sedated, but in no acute distress.  HEENT: Pupils round, equal and reactive to light and accommodation.  NECK: Supple. No JVD or carotid bruits. No lymphadenopathy. No thyromegaly. No discharge from ears or nose. Dry oral mucosa.  CARDIOVASCULAR: S1, S2. Regular rate and rhythm. No murmurs or gallops.  PULMONARY:  Bilateral air entry. No wheezing. No rales. No use of accessory muscles to breathe.  ABDOMEN: Obese, soft. No distention or tenderness. No hepatosplenomegaly or organomegaly.  EXTREMITIES: No edema, clubbing, or cyanosis. Right-sided BKA. Left pedal pulses present.  SKIN: No rash or jaundice.  NEUROLOGY: Unable to do at this time due to patient's sedated status.   LABORATORY DATA: ABG showed pH of 7.39, PCO2 of 34, PO2 of 113; ABGs on ventilation. Chest x-ray shows mild opacity likely secondary to atelectasis. Endotracheal tube terminates approximately 1 cm above the carina. This x-ray was before extubation.  Telemonitor showed normal sinus rhythm in the 67 bpm. IMPRESSIONS: 1.  Acute respiratory failure. 2.  Chronic obstructive pulmonary disease. 3.  Renal carcinoma. 4.  Hypertension.  5.  History of a cerebrovascular accident.  6.  Peripheral vascular disease.  7.  Seizure.  8.  History of alcohol abuse.   PLAN OF TREATMENT:  1.  The patient will be placed for observation. Will continue O2 by nasal cannula and nebulizer, DuoNebs q.4 hours, and we will continue Spiriva.  2.  For hypertension, continue Norvasc and Lopressor. Continue aspirin.  3. GI and DVT prophylaxis.   Discussed the patient's condition and the plan of treatment with Dr. Jacqlyn Larsen and the anesthesiologist.  TIME  SPENT: About 65 minutes.    ____________________________ Demetrios Loll, MD qc:dm D: 07/29/2012 12:32:00 ET T: 07/29/2012 13:43:14 ET JOB#: [id number]  cc: Demetrios Loll, MD, <Dictator> Demetrios Loll MD ELECTRONICALLY SIGNED 07/29/2012 17:11

## 2014-06-25 NOTE — H&P (Signed)
PATIENT NAME:  Brent Ryan, Brent Ryan MR#:  [id number] DATE OF BIRTH:  05-12-1940  DATE OF ADMISSION:  08/27/2012  PRIMARY CARE PHYSICIAN: Brent Cookey A. Henry-Smith, MD  RESIDENCE: The patient is from [facility].  CHIEF COMPLAINT: Shortness of breath, cough with productive phlegm.   HISTORY OF PRESENT ILLNESS: Brent Ryan is a 74 year old Caucasian gentleman with multiple medical problems, including hypertension, history of seizure disorder, stroke, peripheral vascular disease with above-knee amputation on the right and history of COPD with tobacco abuse in the past, who comes in with increasing shortness of breath and productive phlegm/cough for a couple days at the skilled facility. He received Decadron at the nursing home and was started on IM Rocephin treatment. The patient continued to be short of breath, was found to be hypoxic with sats in the 70s when he arrived to the Emergency Room. He was placed on BiPAP. He also received a breathing treatment. He continues to have some wheezing. The patient remained on BiPAP a couple hours, and currently, his sats are 92% to 93% on 5 liters nasal cannula. He is feeling better with shortness of breath, although some audible wheezing is present. He is being admitted for acute on chronic hypoxic respiratory failure with right lower lobe pneumonia and possible mild congestive heart failure.   PAST MEDICAL HISTORY:  1. Alcohol abuse in the past.  2. DVT status post IVC filter placement.  3. Hypertension.  4. Seizure disorder.  5. CVA/stroke.  6. Above-knee amputation secondary to right lower extremity gangrene, this was done in January 2012.  7. History of hyperthyroidism.  8. COPD.  9. History of hepatic encephalopathy in the past.  10. Left renal cell carcinoma, follows up with Brent Ryan.   PAST SURGICAL HISTORY:  1. Right above-knee amputation.  2. Left percutaneous renal cell carcinoma cryotherapy in May 2014.   ALLERGIES: No known drug allergies.    CURRENT MEDICATIONS:  1. Aspirin 81 mg p.o. daily.  2. Decadron 4 mg intramuscular once was given on 25th of June at the nursing home.  3. Delsym 10 mL b.i.d. p.r.n.  4. Depakote 250 mg extended release 1 tablet b.i.d.  5. Drisdol 50,000 international units which is 1.25 mg orally once a month.  6. DuoNebs 3 mL every 6 hours as needed.  7. Ferrous sulfate 325 mg p.o. daily.  8. Flomax 0.4 mg daily.  9. Flovent HFA 1 puff b.i.d. daily.  10. Lidocaine topical 2% gel with applicator to apply topically to catheter once as needed.  11. Methimazole 5 mg 1-1/2 tablets that is 7.5 mg p.o. daily.  12. Metoprolol 25 mg b.i.d.  13. Mintox oral suspension 15 mL every 4 hours as needed for indigestion.  14. Norco 325/5 mg 1 tablet every 8 hours as needed.  15. Norvasc 2.5 mg daily.  16. Omeprazole 20 mg p.o. daily.  17. ProAir 2 puffs 4 times a day.  18. Pyridium 200 mg 1 tablet b.i.d.   19. Rocephin 1 gram dose was given at the nursing home.  20. Senokot-S 1 tablet daily as needed.  21. Spiriva 18 mcg inhalation daily.  22. Tylenol 2 tablets 2 times a day.  23. Zoloft 25 mg p.o. daily.   FAMILY HISTORY: Significant for mother having pancreatic cancer. Father died of lung cancer. This is obtained from old records.   REVIEW OF SYSTEMS:  CONSTITUTIONAL: No fever. Positive for fatigue, weakness.  EYES: No blurred or double vision. No glaucoma.  ENT: No tinnitus, ear pain, hearing  loss.  RESPIRATORY: Positive for cough, wheeze, COPD and shortness of breath.  CARDIOVASCULAR: No chest pain, orthopnea, edema. No palpitations.  GASTROINTESTINAL: No nausea, vomiting, diarrhea, abdominal pain or GERD.  GENITOURINARY: No dysuria or hematuria.  HEMATOLOGY: No anemia or easy bruising.  SKIN: No acne or rash.  MUSCULOSKELETAL: Positive for arthritis.  NEUROLOGIC: no focal wekaness. The patient has weakness generalized.  PSYCHIATRIC: No anxiety or depression.  All other systems reviewed and negative.    PHYSICAL EXAMINATION:  GENERAL: The patient is awake but somewhat sleepy and lethargic. Answers questions on verbal commands. He is afebrile, pulse is 93, blood pressure is 114/70, sats are 93% to 94% on 5 liters nasal cannula oxygen.  HEENT: Atraumatic, normocephalic. PERRLA. EOM intact. Oral mucosa is dry. The patient is edentulous.  NECK: Supple. No JVD. No carotid bruit.  RESPIRATORY: Bilateral wheezing heard. Decreased breath sounds at the bases. I could not appreciate any audible crackles. No respiratory distress or use of accessory muscles.  CARDIOVASCULAR: Mild tachycardia. Both the heart sounds are normal. Rhythm is regular. PMI not lateralized. Chest nontender. No murmur heard.   ABDOMEN: Obese, soft, nontender. No organomegaly. Positive bowel sounds.  NEUROLOGICAL: The patient moves all extremities well. He has right above-knee amputation. Generalized weakness. No focal weakness. Speech clear. Cranial nerves II through XII grossly appear intact.  SKIN: Warm and dry.  PSYCHIATRIC: The patient is awake, he is alert; however, is somewhat sleepy, falls back to sleep intermittently.   LABORATORY: ABG: pH is 7.30, pCO2 is 46, pO2 of 83, FiO2 of 40. Lactic acid is 3.0. B-type natriuretic peptide is 1499. White count is 10.4, H and H are 12.6 and 38.0, platelet count is 250. Creatinine is 2.03, BUN is 16, glucose is 97. LFTs within normal limits, SGPT is 11, SGOT is 5. Anion gap is 13. Troponin is less than 0.02. Chest x-ray shows shallow inspiration. Infiltrate, right lung base. Interstitial prominence by shallow inspiration consistent with pulmonary vascular congestion.   ASSESSMENT: The 74 year old Brent Ryan with multiple medical problems comes in from [facility] with increasing shortness of breath, saturations into 70s, along with audible wheezing in the setting of history of chronic obstructive pulmonary disease. The patient is being admitted with:   1. Acute hypoxic respiratory  failure, suspected from chronic obstructive pulmonary disease exacerbation with early right lower lobe pneumonia and mild congestive heart failure, suspect diastolic, acute. Will admit the patient to telemetry floor. Use p.r.n. BiPAP.  Will continue oxygen. Will continue IV fluids at low rate along with IV Rocephin and Levaquin. The patient is a nursing home resident. Will do blood cultures, sputum cultures. Continue IV Solu-Medrol around the clock along with nebulizer treatment and oral inhalers.  2. Acute congestive heart failure, suspect diastolic in the setting of pneumonia and chronic obstructive pulmonary disease exacerbation. The patient does not appear to be volume overloaded at this time and will hold off on any IV Lasix at this time. Use p.r.n. BiPAP if needed. Use p.r.n. Lasix as needed. Will give IV fluids at a low rate. Check echo Doppler of the heart.  3. Acute renal failure, appears to be prerenal azotemia. The patient's baseline creatinine is 1.1. I will give some IV fluids and check metabolic panel in the morning. Avoid nephrotoxins. Continue monitoring I's and O's.  4. Relative hypotension in the setting of pneumonia and use of BiPAP. The patient will be receiving some IV fluids. Will continue to hold off on Norvasc at this time.  5. History of hyperthyroidism. Continue methimazole.  6. History of deep vein thrombosis. Will give heparin subcutaneous for prophylaxis.  7. Left renal cell carcinoma. The patient has recently had percutaneous cryotherapy on the 28th of May per Brent Ryan. Will continue his Flomax at this time.  8. Further workup according to the patient's clinical course.   Hospital admission plan was discussed with the patient. No family members were present.   CODE STATUS: The patient is a full code.   TIME SPENT: 55 minutes.   ____________________________ Hart Rochester Posey Pronto, MD sap:OSi D: 08/27/2012 13:12:11 ET T: 08/27/2012 13:43:48 ET JOB#: [id number]  cc: Kayde Atkerson A. Posey Pronto,  MD, <Dictator> Ilda Basset MD ELECTRONICALLY SIGNED 08/27/2012 14:34

## 2014-06-25 NOTE — Discharge Summary (Signed)
PATIENT NAME:  Brent Ryan, Brent Ryan MR#:  [id number] DATE OF BIRTH:  1941-01-11  DATE OF ADMISSION:  01/19/2013 DATE OF DISCHARGE:  01/20/2013  ADMITTING PHYSICIAN: Dr. Hillary Bow.  DISCHARGING PHYSICIAN: Gladstone Lighter.  PRIMARY CARE PHYSICIAN: Dr. Devoria Albe at [facility].   DISCHARGE DIAGNOSES: 1.  Acute respiratory failure.  2.  Acute on chronic obstructive pulmonary disease exacerbation.  3.  History of cerebrovascular accident.  4.  History of right above knee amputation secondary to peripheral vascular disease.  5.  Seizure disorder.  6.  History of deep vein thrombosis status post inferior vena cava filter placement.  7.  Left renal carcinoma, following with Dr. Jacqlyn Larsen. 8.  Chronic kidney disease, stage III.  9.  Chronic diastolic congestive heart failure with ejection fraction of 55%.   DISCHARGE HOME MEDICATIONS:  1.  Drisdol 50,000 International Units 1 capule once a month on the 27th of each month.  2.  Metoprolol 25 mg p.o. b.i.d.  3.  Flovent inhaler 1 puff twice a day.  4.  Omeprazole 20 mg p.o. daily.  5.  Spiriva 18 mcg inhalation capsule daily.  6.  Extra Strength Tylenol 1000 mg twice a day at 8:00 a.m. and 8:00 p.m.  7.  Aspirin 81 mg p.o. daily.  8.  Ferrous sulfate 325 mg p.o. daily.  9.  Methimazole at 7.5 mg orally once a day.  10.  Flomax 0.4 mg p.o. daily.  11.  Senokot 50/8.6 mg 1 tablet daily for constipation.  12.  ProAir inhaler 2 puffs 4 times a day.  13.  MiraLAX powder twice a day for constipation.  14.  Lexapro 10 mg p.o. daily.  15.  Prednisone taper.  16.  Norco 5/325 mg 1 tablet q.8 hours p.r.n. for pain.  17.  DuoNeb 3 mL q.6 hours p.r.n.  18.  Levaquin 500 mg daily for 5 days.   DISCHARGE HOME OXYGEN:  Two liters.   DISCHARGE DIET: Low-sodium diet.   DISCHARGE ACTIVITY: As tolerated.     FOLLOWUP INSTRUCTIONS:  PCP followup in 1 to 2 weeks.   LABS AND IMAGING STUDIES:  Prior to discharge, WBC 6.1, hemoglobin 9.1, hematocrit  27.6, platelet count 145.   Sodium 132, potassium 4.3, chloride 101, bicarb 24, BUN 33, creatinine 1.52, glucose 137 and calcium of 9.2. Blood cultures negative on admission. ABG showing pH of 7.38, pCO2 of 31, pO2 of 95 and sats on 98% on 4 liters oxygen. Admission chest x-ray showing coarse lung markings with no acute changes. Repeat chest x-ray prior  to discharge showing emphysematous changes with no superimposed acute cardiopulmonary process. Mild cardiomegaly noted.  BRIEF HOSPITAL COURSE: Mr. Seth is a 74 year old male who is a resident at [facility] for the past 3 years, known history of smoking in the past, COPD, hypertension, seizure disorder, above knee amputation for the right light, is bedbound at baseline, who presents to the hospital secondary to significant shortness of breath and hypoxia. He was noted to be in COPD exacerbation.  1.  Acute respiratory failure secondary to acute on chronic obstructive pulmonary disease exacerbation, initially requiring 4 liters oxygen when he came in, currently weaned down to 2 liters.  He did respond very good to IVs steroids.  His lungs are clear on exam. He is being discharged on a prednisone taper. Because of his high risk COPD nature, though there is no infiltrate, with the productive cough he had,  he was placed on Levaquin and will finish up  the course as an outpatient. He will continue his DuoNebs p.r.n. and on Spiriva and Flovent, which he will continue after discharge.  2.  Chronic kidney disease stage III. His creatinine has been stable prior to discharge.   His code status has been otherwise uneventful in the hospital.    DISCHARGE CONDITION:  Stable.   DISCHARGE DISPOSITION: Back to [facility] skilled nursing facility.   TIME SPENT ON DISCHARGE: 45 minutes.   CODE STATUS: FULL CODE.    ____________________________ Gladstone Lighter, MD rk:dmm D: 01/20/2013 11:41:00 ET T: 01/20/2013 11:58:15 ET JOB#: [id number]  cc: Gladstone Lighter, MD, <Dictator> Devoria Albe, MD  [place] MD ELECTRONICALLY SIGNED 01/27/2013 18:15

## 2014-06-25 NOTE — Op Note (Signed)
PATIENT NAME:  Brent Ryan, Brent Ryan MR#:  [id number] DATE OF BIRTH:  1940-05-29  DATE OF PROCEDURE:  07/29/2012  DATE OF OPERATION: 07/29/2012.   PRINCIPAL DIAGNOSIS: Left papillary renal cell carcinoma.   POSTOPERATIVE DIAGNOSIS: Left papillary renal cell carcinoma.   PROCEDURE: Left percutaneous CT-guided renal cryotherapy.   SURGEON: Edrick Oh, M.D.   ANESTHESIA: General endotracheal anesthesia.   INDICATIONS: The patient is a 74 year old gentleman who was previously found to have a solid left lower pole posterior renal mass. There has been gradual progression in size. Due to his multiple health issues the decision was made to proceed with percutaneous biopsy by interventional radiology. This returned demonstrating papillary renal cell carcinoma. He presents for left CT-guided percutaneous renal cryotherapy.   PROCEDURE: After informed consent was obtained the patient was taken to the CT suite and placed in the prone position on the CT gantry under general endotracheal anesthesia. The patient was then prepped and draped in the usual standard fashion. The approximate site of the tumor was marked on the overlying skin. A small skin incision was made at this level. A 2.4 cm cryotherapy probe was placed, first medially into the inferior aspect of the tumor. This was approximately 1 cm from the inferior-most aspect of the tumor. It was approximately 5 to 6 mm from the edge of the tumor. A second 2.4 cm cryotherapy needle was placed at the same level just lateral to the previously-placed needle. Good positioning was noted on both needles, with no need for repositioning. Once the needles were in place the freeze was initiated. After 3 minutes examination demonstrated good coverage of the entire inferior pole of the kidney. No significant bleeding or other abnormalities were noted. There was no bowel or other structure in close proximity. A 10-minute freeze was then performed; 50 mL of contrast was injected. A  CT scan was obtained, with coronal and sagittal reconstructions demonstrating good coverage of the entire inferior pole portion of the kidney. The needles were then thawed for 8 minutes. A second freeze was then undertaken. Confirmation of freeze parameters was once again noted at  3 minutes. The freeze was continued for total of 10 minutes. The needles were then thawed for approximately 3 minutes and removed without difficulty.   A Telfa and Tegaderm dressing was placed over the incision site. The patient tolerated the procedure well. There were no problems or complications.    ____________________________ Denice Bors Jacqlyn Larsen, MD bsc:dm D: 07/29/2012 09:52:52 ET T: 07/29/2012 10:07:57 ET JOB#: [id number]  cc: Denice Bors. Jacqlyn Larsen, MD, <Dictator> Denice Bors Cinsere Mizrahi MD ELECTRONICALLY SIGNED 07/31/2012 11:31

## 2014-06-25 NOTE — H&P (Signed)
PATIENT NAME:  Brent Ryan, Brent Ryan MR#:  [id number] DATE OF BIRTH:  [birth year]  DATE OF ADMISSION:  01/19/2013  PRIMARY CARE PHYSICIAN: Marisa Hua, MD  CHIEF COMPLAINT: Shortness of breath and hypoxia.   HISTORY OF PRESENT ILLNESS: A 74 year old Caucasian male patient with history of hypertension, seizure disorder, stroke, COPD, congestive heart failure with remote tobacco abuse, presents to the hospital sent in from skilled nursing facility with hypoxia and some shortness of breath. The patient was satting in the low 80s on room air. He does not wear oxygen normally. He is needing 3 to 4 L oxygen in the Emergency Room. Chest x-ray showing mild CHF along with wheezing on exam and is being admitted to the hospitalist service.   He does not complain of any PND, orthopnea, edema or cough.   PAST MEDICAL HISTORY:  1.  Alcohol abuse in the past. 2.  DVT, status post IVC.  3.  Hypertension.  4.  Seizure disorder.  5.  CVA.  6.  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  7.  Hyperthyroidism.  8.  COPD.  9.  Hepatic encephalopathy in the past.  10. Left renal cell carcinoma, follows with Dr. Jacqlyn Larsen PAST SURGICAL HISTORY:  1.  Right above-knee amputation.  2.  Left percutaneous renal cell carcinoma cryotherapy in May 2014.   ALLERGIES: None.   FAMILY HISTORY: Significant for mother having pancreatic cancer. Father died of lung cancer.   REVIEW OF SYSTEMS: CONSTITUTIONAL: Complains of some fatigue. EYES: No blurred vision, pain or redness. ENT: No tinnitus, ear pain or hearing loss. RESPIRATORY: Has COPD. Positive for wheezing. CARDIOVASCULAR: No chest pain, orthopnea or edema. GASTROINTESTINAL: No nausea, vomiting, diarrhea, abdominal pain. GENITOURINARY: No dysuria, hematuria or frequency. ENDOCRINE: No polyuria. No thyroid problems. HEMATOLOGY: No anemia or bruising. INTEGUMENTARY: No acne, rash, lesion. MUSCULOSKELETAL: Has arthritis. NEUROLOGIC: No focal weakness. The patient had a stroke in  the past. PSYCHIATRIC: No anxiety or depression.   HOME MEDICATIONS: Include:  1.  Aldactone 25 mg daily.  2.  Aspirin 81 mg daily.  3.  Vitamin D 50,000 units once a month.  4.  Ferrous sulfate 325 mg oral once a day. 5.  Flomax 0.4 mg daily.  6.  Flovent HFA 1 puff inhaled 2 times a day.  7.  Lexapro 10 mg daily.  8.  Methimazole 5 mg 1.5 tablets daily.  9.  Metoprolol tartrate 25 mg 2 times a day.  10. MiraLax 17 grams oral 2 times a day.  11. Norco 325/5 mg oral every 8 hours as needed.  12. Omeprazole 20 mg daily.  13. Pro-Air HFA 2 puffs inhaled 4 times a day as needed.  14. Senna 1 tablet oral once a day.  15. Spiriva 18 mcg inhaled once a day.  16. Tylenol 500 mg 2 tablets oral 2 times a day as needed for pain and fever.   PHYSICAL EXAMINATION:   VITAL SIGNS: Temperature 98.2, pulse of 80, respirations 24, blood pressure 129/73, saturating 92% on 3 L and 84% on room air.  GENERAL: Obese, Caucasian male patient lying in bed in mild respiratory distress.  PSYCHIATRIC: Alert, awake and pleasant.  HEENT: Atraumatic, normocephalic. Oral mucosa moist and pink. External ears and nose normal. Pallor positive. No icterus.  NECK: Supple. No thyromegaly or palpable lymph nodes. Trachea midline. No carotid bruit, JVD.  CARDIOVASCULAR: S1, S2, without any murmurs.  RESPIRATORY: Increased work of breathing. Mild wheezing and crackles.  GASTROINTESTINAL: Soft abdomen, nontender. Bowel sounds present. No  murmurs or gallops palpable.  SKIN: Warm and dry. No rash or ulcers.  MUSCULOSKELETAL: No joint swelling, redness, effusion of the large joints. Normal muscle tone. Has right knee amputation.  NEUROLOGICAL: Motor strength 5 over 5 in upper extremities.  LYMPHATIC: No cervical lymphadenopathy.   LABORATORY STUDIES: Glucose of 130. BNP of 3900. BUN 22, creatinine 1.69, sodium 138, potassium 4.9. AST, ALT, alkaline phosphatase, bilirubin normal. Troponin 0.02. WBC 11, hemoglobin 10.8,  platelets 117, MCV 78. ABG shows pH of 7.38 with pCO2 of 31.   Chest x-ray shows mild pulmonary edema.   ASSESSMENT AND PLAN:  1.  Acute respiratory failure secondary to acute chronic obstructive pulmonary disease exacerbation and acute on chronic diastolic congestive heart failure. The patient does not seem to have significant pulmonary edema on the chest x-ray. We will give 1 dose of intravenous Lasix 60 mg and monitor creatinine and intakes and outputs. The patient will be on oxygen to keep oxygen greater than 92%. Nebulizers, intravenous steroids and antibiotics. Wean off oxygen as tolerated to keep saturations over 90%.  2.  Chronic kidney disease, stage III, monitor creatinine.  3.  Thrombocytopenia, mild at 117. He has not had thrombocytopenia in the past and needs to be monitored for any bleeding or worsening.  4.  Hypertension.  5.  Deep vein thrombosis prophylaxis with heparin.   CODE STATUS: Full code.   TIME SPENT TODAY ON THIS CASE: 40 minutes.   ____________________________ Leia Alf Lilas Diefendorf, MD srs:aw D: 01/19/2013 06:27:57 ET T: 01/19/2013 06:49:05 ET JOB#: [id number]  cc: Alveta Heimlich R. Glendene Wyer, MD, <Dictator> Neita Carp MD ELECTRONICALLY SIGNED 01/19/2013 20:49

## 2014-06-26 NOTE — Consult Note (Signed)
Brief Consult Note: Diagnosis: acute cholecystitis.   Patient was seen by consultant.   Consult note dictated.   Recommend to proceed with surgery or procedure.   Comments: Moderate to high surgical risk due to COPD (stable for now) and h/o PVD.  Benefit of possible surgical intervention outweighs risk.  Electronic Signatures: Harrie Foreman (MD)  (Signed 22-Dec-15 03:11)  Authored: Brief Consult Note   Last Updated: 22-Dec-15 03:11 by Harrie Foreman (MD)

## 2014-06-26 NOTE — Consult Note (Signed)
PATIENT NAME:  Brent Ryan, Brent Ryan MR#:  [id number] DATE OF BIRTH:  09-Feb-1941  DATE OF CONSULTATION:  02/22/2014  REFERRING PHYSICIAN:  Micheline Maze, MD CONSULTING PHYSICIAN:  Norva Riffle. Marcille Blanco, MD  CONSULT QUESTION: Preoperative clearance.  HISTORY OF PRESENT ILLNESS: This is a 74 year old Caucasian male who was admitted to the hospital from his nursing home for acute abdominal pain. The patient was found to have acute cholecystitis on CT scan with probable choledocholithiasis as well. The patient is afebrile and non septic. He is hemodynamically stable, and his abdominal pain and nausea are reasonably managed at this time.  The referring physician requested medical management and preop clearance for possible stone removal from the cystic duct.   PAST MEDICAL HISTORY: Dementia, COPD, peripheral vascular disease, hyperthyroidism, a history of a bleeding gastric, history of renal cell carcinoma, history of CVA, and also left lower extremity contraction.   PAST SURGICAL HISTORY: The patient has had a right above-the-knee amputation secondary to an ischemic limb. He has also undergone IVC filter placement, as well as percutaneous cryotherapy of the left kidney.   SOCIAL HISTORY: The patient currently lives in a nursing home. He has a history of alcohol abuse, but has not been drinking since he has been a resident of the facility.   FAMILY HISTORY: The patient's father is deceased of lung cancer.   MEDICATIONS:  1.  Albuterol with ipratropium 2.5 mg/0.5 mg per 3 mL inhaler, 1 inhalation every 6 hours as needed for coughing, wheezing or shortness of breath.  2.  Aspirin enteric-coated 81 mg delayed release, 1 tablet p.o. every morning.  3.  Drisdol 50,000 international units, or 1.25 mg oral capsule, 1 capsule p.o. monthly.  4.  Ferrous sulfate 325 mg 1 tablet p.o. every morning.  5.  Flomax 0.4 mg 1 capsule p.o. daily.  6.  Flovent high flow inhaler 44 mcg per inhalations, 1 puff inhaled 2 times  a Brent Ryan.  7.  Lexapro 10 mg 1 tablet p.o. daily.  8.  Methimazole 5 mg 1-1/2 tablets p.o. daily.  9.  Metoprolol tartrate 25 mg, 1 tablet p.o. b.i.d.  10.   MiraLax 17 g reconstituted powder orally 2 times a Brent Ryan.  11.  Norco 325 mg/5 mg tablet, 1 tablet p.o. every 8 hours as needed for pain.  12.  Omeprazole 20 mg delayed release capsule, 1 capsule p.o. every morning.  13.  ProAir high flow inhaler 90 mcg/inhalations, 2 puffs inhaled 4 times a Brent Ryan as needed.  14.  Senna 50 mg/8.6 mg, 1 tablet p.o. daily for constipation.  15.  Tylenol Extra Strength 500 mg, 2 tablets p.o. b.i.d. as needed.   ALLERGIES: No known drug allergies.   PERTINENT LABORATORY RESULTS AND RADIOGRAPHIC FINDINGS: Serum glucose is 109. BUN 18, creatinine 0.97, serum sodium 134, potassium is 4.2, chloride is 99, bicarbonate 25, calcium is 7.9, amylase 23, lipase 71, serum albumin 2, total bilirubin 0.5, alkaline phosphatase 102, AST 11, ALT is also 11. White blood cell count is 9.7, hemoglobin is 9.8, hematocrit is 30. Urinalysis is negative for infection.   CT scan of the abdomen with contrast shows findings highly suspicious for acute cholecystitis. There is a 5 mm hyperdensity lodged at the neck of the gallbladder and cystic duct. There is wall thickening and inflammatory stranding about the colon at the level of the hepatic flexure. There is a 2.8 cm cystic lesion with mural calcification in the lower pole of the left kidney, likely related to treatment for  a history of renal cell carcinoma. There is a 7 mm obstructive left renal calculus, as well as extensive aorto-bi-iliac atherosclerotic disease. The infrarenal IVC filter is in place, and there is a small hiatal hernia.   PHYSICAL EXAMINATION:  VITAL SIGNS: Temperature is 99.2, pulse 84, respirations 18, blood pressure 118/69, pulse oximetry is 94% on room air.  GENERAL: The patient is alert and oriented to person and time. He is in no apparent distress.  HEENT:  Normocephalic, atraumatic. Pupils equal, round, and reactive to light and accommodation. Extraocular movements are intact. Mucous membranes are moist.  NECK: Trachea is midline. No adenopathy.  CHEST: Symmetric and atraumatic.  CARDIOVASCULAR: Regular rate and rhythm. Normal S1, S2. No rubs, clicks, or murmurs appreciated.  LUNGS: Clear to auscultation bilaterally. Normal effort and excursion.  ABDOMEN: Positive bowel sounds. Soft. Diffusely tender, with positive Murphy sign, but no guarding or rebound tenderness. There is no hepatosplenomegaly.  GENITOURINARY: Deferred.  MUSCULOSKELETAL: The patient moves and his extremities equally. There is 5/5 strength at the hip, in the lower extremities, and in both arms bilaterally. The patient has a well-healed stump of the right above-the-knee amputation.  SKIN: No rashes or lesions.  EXTREMITIES: No clubbing, cyanosis, or edema.  NEUROLOGIC: Cranial nerves II through XII are grossly intact. The patient clearly has some memory difficulty, and is not oriented to place.  PSYCHIATRIC: Mood is normal. Affect is congruent.   ASSESSMENT AND PLAN: This is a 74 year old male admitted with dementia admitted for acute cholecystitis. He is moderate to high risk for a surgical procedure due to his extensive history of peripheral vascular disease. He also has COPD, but is not having thoracic surgery nor does he smoke, which mitigates this risk to some degree.   1.  Hyperthyroidism. I see that the patient has metoprolol on his medication list, but does not have his methimazole ordered during this hospitalization. I will check a TSH and recommend resuming methimazole if TSH his is suppressed. The patient's heart rate is normal, which is a good indication that his thyroid function is at least close to normal. Adjusting his thyroid hormone levels will be helpful in the postoperative period, but does not pose an immediate risk preoperatively given his normal vital signs.  2.   Anemia. The patient's hemoglobin and hematocrit have been relatively stable, although they are trending down, likely due to routine blood draws here  in the hospital. I recommend continuing iron supplementation, which I have ordered, especially postoperatively it would be a good idea to monitor the patient's blood count and transfuse him if his hemoglobin falls below 8. The patient does not have coronary artery disease, but his peripheral vascular disease is a risk equivalent. Thus, we need to maintain his hemoglobin at least above 8.  3.  Chronic obstructive pulmonary disease. Continue albuterol and Spiriva, or Combivent alone. The patient has not had any respiratory difficulty this hospitalization. If he does develop cough, shortness of breath or increased sputum production, this would represent exacerbation of his COPD and could lead to acute respiratory failure.  4.  Dementia. The patient is currently pleasant, but apparently called out aggressively and loudly. Continue citalopram as well as Ativan as needed.  5.  Deep vein thrombosis prophylaxis. The patient has an IVC filter in place, but I see that he is on Lovenox. I agree with this choice, although it may pose increased risk for bleeding preoperatively. I will leave this to the discretion of the primary surgical staff.  6.  Acute cholecystitis. Operative plans per surgery team. 7.  The patient's Code Status should be clarified.    ____________________________ Norva Riffle. Marcille Blanco, MD msd:MT D: 02/23/2014 08:28:00 ET T: 02/23/2014 09:33:02 ET JOB#: [id number]  cc: Norva Riffle. Marcille Blanco, MD, <Dictator> Norva Riffle Taaliyah Delpriore MD ELECTRONICALLY SIGNED 02/23/2014 23:59

## 2014-06-26 NOTE — Op Note (Signed)
PATIENT NAME:  Brent Ryan, Brent Ryan MR#:  [id number] DATE OF BIRTH:  1940-11-14  DATE OF PROCEDURE:  02/24/2014  PREOPERATIVE DIAGNOSIS: Acute cholecystitis.   POSTOPERATIVE DIAGNOSIS: Acute gangrenous cholecystitis.   PROCEDURE PERFORMED: Attempted laparoscopic cholecystectomy with conversion to open cholecystectomy.   SURGEON: Sherri Rad, MD   ASSISTANT: None.   ANESTHESIA: General endotracheal.   ESTIMATED BLOOD LOSS: 200 mL.   SPECIMENS: Gallbladder with contents.   FINDINGS: Gangrenous cholecystitis.   DRAINS: A 19 mm Blake drain.   DESCRIPTION OF PROCEDURE: With informed consent, supine position, general endotracheal anesthesia, the patient's abdomen was widely clipped of hair, prepped and draped with ChloraPrep solution. Timeout was observed. A 12 mm blunt Hassan trocar was placed open technique through an infraumbilical transverse oriented skin incision. Pneumoperitoneum being established.   A 5 mm Bladeless trocar was placed in the epigastrium, two 5 mm trocars were placed in the right lateral abdomen. There was a large amount of omentum adherent to the gallbladder. The gallbladder was identified in the right upper quadrant. It appeared to be gangrenous. Grasping of the gallbladder along its fundus demonstrated a large amount of pus which was aspirated. Gallbladder was extremely friable, necrotic, and tore at multiple sites. Despite approximately 20 minutes of attempted laparoscopic cholecystectomy, multiple openings in the gallbladder were encountered encroaching onto the infindibular pouch.   The procedure was then converted to an open technique through a right transverse right upper quadrant skin incision fashioned with a scalpel and carried through musculofascial layers with electrocautery and the peritoneum being opened sharply. A self-retaining abdominal retractor was placed. The right lobe of the liver was packed superiorly elevating the gallbladder. The gallbladder was then  retrieved off the gallbladder fossa utilizing finger fracture technique. A large amount of pus was extruded, irrigated, and aspirated. Finger dissection demonstrated the cystic duct, which was easily identified at this point. No obvious cystic artery was clipped. The cystic duct was triply clipped on the portal side, singly clipped on the gallbladder side with a 10 mm clip applier and divided. The area of the cystic duct appeared to be spared of the gangrenous process. With the right upper quadrant irrigated and aspirated dry, point hemostasis in the gallbladder fossa was achieved with electrocautery set on 60. A 19 mm Blake drain was directed into the space through a separate stab incision in the right lower quadrant. Drain site was secured nylon suture. With lap and needle count correct x 2, the omentum was then placed into the gallbladder fossa. The fascia was then closed at extremes of the wound utilizing looped #1 PDS. The infraumbilical fascial defect was closed with an additional figure-of-eight vertically oriented #0 Vicryl suture. The existing stay sutures tied to each other. Subcutaneous tissues were irrigated. Skin edges were reapproximated utilizing a skin stapler and dressings were applied.   The patient was subsequently extubated and taken to the recovery room in stable and satisfactory condition by anesthesia services.     ____________________________ Jeannette How Marina Gravel, MD mab:bm D: 02/24/2014 23:05:48 ET T: 02/25/2014 03:49:56 ET JOB#: [id number]  cc: Elta Guadeloupe A. Marina Gravel, MD, <Dictator> Hortencia Conradi MD ELECTRONICALLY SIGNED 02/26/2014 21:57

## 2014-06-26 NOTE — H&P (Signed)
PATIENT NAME:  Brent Ryan, Brent Ryan MR#:  [id number] DATE OF BIRTH:  03/14/1940  DATE OF ADMISSION:  02/22/2014  PRIMARY CARE PHYSICIAN:  Beverley Fiedler  ADMITTING PHYSICIAN: Rodena Goldmann III, MD   CHIEF COMPLAINT: Abdominal pain, fever.   BRIEF HISTORY: Brent Ryan is a 74 year old gentleman seen in the Emergency Room for evaluation of possible acute cholecystitis. The patient is a very poor historian referred from the nursing home with an admitting diagnosis of fevers. He did not have significant elevation in his white blood cell count at time of admission. CT scan was performed, which demonstrated changes consistent with possible acute cholecystitis and an impacted cystic duct stone. On questioning, the patient does complain of significant right-sided, right chest, right abdominal pain. Most of this history is taken from the chart, as he is agitated, angry, upset and not cooperative with the interview. He has been admitted multiple times in the last several years for acute on chronic respiratory failure believed to be secondary to severe chronic obstructive lung disease. He has a history of seizure disorder, hypertension, stroke, chronic obstructive lung disease, congestive heart failure. He also has a history of left renal cell carcinoma treated with cryosurgery. He denies any abdominal history. Denies history of hepatitis, yellow jaundice, pancreatitis, peptic ulcer disease, previous diagnosis of gallbladder disease or diverticulitis. However, I am not certain that that history is complete and it is very difficult to tell from his previous record.   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 He has a significant left lower extremity contracture from his stroke. He is bed bound. He has no medical allergies.   MEDICATIONS: Aspirin 81 mg p.o. daily, albuterol 2.5  mg q.4h. p.r.n., ferrous sulfate 325 once a day, Flomax 0.4 mg once a day, Flovent 44 mcg inhaler 2 times a day, Lexapro 10 mg once a day, metoprolol 25 mg b.i.d., methimazole 7.5 mg once a day, MiraLax 17 grams once a day, Norco 5/325 every 8 hours p.r.n., omeprazole 20 mg once a day, senna 50 mcg b.i.d. once a day, Spiriva 18 mcg once a day.   ALLERGIES: He has no medical allergies.   PHYSICAL EXAMINATION:  GENERAL: He is lying in bed, confused, calling out.  VITAL SIGNS: Temperature is 99.5, blood pressure is 111/70, heart rate is 94 and regular oxygen saturation is 96% on room air.  HEENT: Largely unremarkable, he is anicteric and he has no pupillary abnormalities.  NECK: Supple, nontender, with midline trachea.  CHEST: Has very diminished breath sounds bilaterally, but no wheezing noted. He appears to have normal pulmonary excursion. He does complain of right-sided pain with a deep breath. HEART:  Sounds very distant, but I hear no murmurs or gallops. He seems to be in normal sinus rhythm.  ABDOMEN: Distended with a marked right upper quadrant tenderness, rebound and guarding. He actually slaps my hands away trying examine his right upper quadrant.  EXTREMITIES:  I cannot get him to cooperate with a lower extremity exam. His left leg is contracted and his right leg is surgically absent. I cannot get him to assist in moving to assess his sacrum. We will arrange for that evaluation while on the floor.  PSYCHIATRIC: Reveals significant dementia and complete disorientation.   Independent review of the CT scan. He does have markedly edematous gallbladder with thickened gallbladder wall, enhancement around, pericholecystic fluid and what appears to be impacted cystic  duct stone. Common bile duct does not appear to be enlarged. He has a persistent mass or scar in the superior pole of his left kidney and he has multiple cysts noted in his right kidney. Otherwise, abdominal CT reveals significant vascular  occlusive disease and a vena cava filter. Filter appears to be just below the renal veins.   I have independently reviewed his CT scan. I agree with the findings. This gentleman is a terrible surgical candidate. We will ask internal medicine service to assist in management of his multiple medical problems and see if we can get a risk assessment for intervention. He would probably be best served with a percutaneous drainage, and then consideration of intervention when he has maximized his medical stability. Will plan to be in the hospital. I reviewed the case with the on-call surgeon tomorrow. We will make a decision regarding further intervention.   ____________________________ Rodena Goldmann III, MD rle:kl D: 02/22/2014 20:47:00 ET T: 02/22/2014 21:04:17 ET JOB#: [id number] Rodena Goldmann MD ELECTRONICALLY SIGNED 02/24/2014 23:08

## 2014-06-26 NOTE — Discharge Summary (Signed)
PATIENT NAME:  Brent Ryan, Brent Ryan MR#:  [id number] DATE OF BIRTH:  [birth year]  DATE OF ADMISSION:  02/22/2014 DATE OF DISCHARGE:  03/01/2014  DISCHARGE DIAGNOSIS: Acute gangrenous cholecystitis.   PROCEDURE PERFORMED: Open cholecystectomy.   PAST MEDICAL HISTORY: 1. History of alcohol abuse.  2. History of DVT.  3. Hypertension.  4. CVA.  5. History of seizures.  6. History of above-the-knee amputation.  7. History of cryoablation to kidney carcinoma.   DISCHARGE MEDICATIONS ARE AS FOLLOWS:  5000 units p.o. q. monthly. 2. Metoprolol 25 mg p.o. b.i.d. 3. Flovent 1 puff b.i.d.  4. Omeprazole 20 mg p.o. daily.  5. Spiriva 18 mcg inhaled daily.  6. Tylenol b.i.d.  7. Aspirin enteric 81 p.o. daily. 8. Iron sulfate 325 mg p.o. daily.  p.o. daily. 10. Flomax 0.4 mg p.o. daily.  11. Senna 1 tablet p.o. daily p.r.n. constipation. 12. ProAir HFA 2 puffs q.i.d. 13. MiraLax 17 b.i.d.  14. Lexapro 10 mg p.o. daily. 15. DuoNebs 3 mL p.o. q.6 h. p.r.n. shortness of breath. 16. Norco 1 tablet p.o. q.8 h. p.r.n. pain.  INDICATION FOR ADMISSION: Mr. Tapp is a pleasant, 74 year old, who presented with cholecystitis. He was admitted, worked up, and underwent cholecystectomy on December 23.   HOSPITAL COURSE AS FOLLOWS: The patient underwent cholecystectomy, which was open, on December 23. Postoperatively, diet was advanced to regular and was converted from IV to p.o. pain medications. At the time of discharge, was tolerating good p.o. with good p.o. pain control and was voiding and stooling without difficulties.   DISCHARGE INSTRUCTIONS: Mr. Graham is to follow up with Dr. Marina Gravel on February 5 or 6. He is to call or return to the ED if he has increased pain, nausea, vomiting, redness, or drainage from the incision.    ____________________________ Glena Norfolk Alger Kerstein, MD cal:JT D: 03/01/2014 10:45:00 ET T: 03/01/2014 11:31:21 ET JOB#: [id number]  cc: Harrell Gave A. Winner Valeriano, MD,  <Dictator> Floyde Parkins MD ELECTRONICALLY SIGNED 03/02/2014 16:28

## 2014-06-28 LAB — SURGICAL PATHOLOGY

## 2014-07-08 ENCOUNTER — Other Ambulatory Visit
Admission: RE | Admit: 2014-07-08 | Discharge: 2014-07-08 | Disposition: A | Discharge status: Home / Self Care | Payer: Medicare Other | Source: Skilled Nursing Facility | Attending: Radiology | Admitting: Radiology

## 2014-07-08 DIAGNOSIS — R109 Unspecified abdominal pain: Secondary | ICD-10-CM | POA: Diagnosis present

## 2014-07-08 LAB — CBC WITH DIFFERENTIAL/PLATELET
Basophils Absolute: 0.1 10*3/uL (ref 0–0.1)
Basophils Relative: 1 %
Eosinophils Absolute: 0.1 10*3/uL (ref 0–0.7)
Eosinophils Relative: 1 %
HEMATOCRIT: 39.1 % — AB (ref 40.0–52.0)
HEMOGLOBIN: 12.5 g/dL — AB (ref 13.0–18.0)
LYMPHS PCT: 7 %
Lymphs Abs: 0.6 10*3/uL — ABNORMAL LOW (ref 1.0–3.6)
MCH: 27.1 pg (ref 26.0–34.0)
MCHC: 31.9 g/dL — AB (ref 32.0–36.0)
MCV: 84.8 fL (ref 80.0–100.0)
MONOS PCT: 6 %
Monocytes Absolute: 0.5 10*3/uL (ref 0.2–1.0)
NEUTROS ABS: 7.6 10*3/uL — AB (ref 1.4–6.5)
Neutrophils Relative %: 85 %
Platelets: 210 10*3/uL (ref 150–440)
RBC: 4.62 MIL/uL (ref 4.40–5.90)
RDW: 16.2 % — ABNORMAL HIGH (ref 11.5–14.5)
WBC: 8.8 10*3/uL (ref 3.8–10.6)

## 2014-07-08 LAB — BASIC METABOLIC PANEL
ANION GAP: 10 (ref 5–15)
BUN: 14 mg/dL (ref 6–20)
CALCIUM: 8.9 mg/dL (ref 8.9–10.3)
CO2: 25 mmol/L (ref 22–32)
CREATININE: 0.81 mg/dL (ref 0.61–1.24)
Chloride: 102 mmol/L (ref 101–111)
Glucose, Bld: 99 mg/dL (ref 65–99)
POTASSIUM: 4.8 mmol/L (ref 3.5–5.1)
SODIUM: 137 mmol/L (ref 135–145)

## 2014-09-27 ENCOUNTER — Inpatient Hospital Stay
Admission: EM | Admit: 2014-09-27 | Discharge: 2014-09-30 | DRG: 379 | Disposition: A | Discharge status: Skilled Nursing Facility | Payer: Medicare Other | Attending: Internal Medicine | Admitting: Internal Medicine

## 2014-09-27 ENCOUNTER — Encounter: Payer: Self-pay | Admitting: *Deleted

## 2014-09-27 DIAGNOSIS — I2510 Atherosclerotic heart disease of native coronary artery without angina pectoris: Secondary | ICD-10-CM | POA: Diagnosis present

## 2014-09-27 DIAGNOSIS — Z87891 Personal history of nicotine dependence: Secondary | ICD-10-CM

## 2014-09-27 DIAGNOSIS — R102 Pelvic and perineal pain: Secondary | ICD-10-CM | POA: Diagnosis present

## 2014-09-27 DIAGNOSIS — J449 Chronic obstructive pulmonary disease, unspecified: Secondary | ICD-10-CM | POA: Diagnosis present

## 2014-09-27 DIAGNOSIS — K317 Polyp of stomach and duodenum: Secondary | ICD-10-CM | POA: Insufficient documentation

## 2014-09-27 DIAGNOSIS — F329 Major depressive disorder, single episode, unspecified: Secondary | ICD-10-CM | POA: Diagnosis present

## 2014-09-27 DIAGNOSIS — K920 Hematemesis: Secondary | ICD-10-CM | POA: Diagnosis present

## 2014-09-27 DIAGNOSIS — Z8249 Family history of ischemic heart disease and other diseases of the circulatory system: Secondary | ICD-10-CM

## 2014-09-27 DIAGNOSIS — R339 Retention of urine, unspecified: Secondary | ICD-10-CM

## 2014-09-27 DIAGNOSIS — R338 Other retention of urine: Secondary | ICD-10-CM

## 2014-09-27 DIAGNOSIS — I10 Essential (primary) hypertension: Secondary | ICD-10-CM | POA: Diagnosis present

## 2014-09-27 DIAGNOSIS — Z7982 Long term (current) use of aspirin: Secondary | ICD-10-CM

## 2014-09-27 DIAGNOSIS — N35919 Unspecified urethral stricture, male, unspecified site: Secondary | ICD-10-CM

## 2014-09-27 DIAGNOSIS — Z89611 Acquired absence of right leg above knee: Secondary | ICD-10-CM

## 2014-09-27 DIAGNOSIS — R110 Nausea: Secondary | ICD-10-CM | POA: Diagnosis present

## 2014-09-27 DIAGNOSIS — N359 Urethral stricture, unspecified: Secondary | ICD-10-CM

## 2014-09-27 DIAGNOSIS — K31819 Angiodysplasia of stomach and duodenum without bleeding: Secondary | ICD-10-CM | POA: Insufficient documentation

## 2014-09-27 HISTORY — DX: Chronic obstructive pulmonary disease, unspecified: J44.9

## 2014-09-27 HISTORY — DX: Contracture, left knee: M24.562

## 2014-09-27 HISTORY — DX: Dysphagia, unspecified: R13.10

## 2014-09-27 HISTORY — DX: Depression, unspecified: F32.A

## 2014-09-27 HISTORY — DX: Major depressive disorder, single episode, unspecified: F32.9

## 2014-09-27 HISTORY — DX: Other chronic pain: G89.29

## 2014-09-27 HISTORY — DX: Essential (primary) hypertension: I10

## 2014-09-27 HISTORY — DX: Acquired absence of unspecified leg above knee: Z89.619

## 2014-09-27 HISTORY — DX: Atherosclerotic heart disease of native coronary artery without angina pectoris: I25.10

## 2014-09-27 LAB — COMPREHENSIVE METABOLIC PANEL
ALBUMIN: 3.7 g/dL (ref 3.5–5.0)
ALK PHOS: 95 U/L (ref 38–126)
ALT: 9 U/L — ABNORMAL LOW (ref 17–63)
ANION GAP: 9 (ref 5–15)
AST: 15 U/L (ref 15–41)
BUN: 14 mg/dL (ref 6–20)
CHLORIDE: 103 mmol/L (ref 101–111)
CO2: 27 mmol/L (ref 22–32)
Calcium: 9.1 mg/dL (ref 8.9–10.3)
Creatinine, Ser: 0.86 mg/dL (ref 0.61–1.24)
GFR calc Af Amer: >60 mL/min (ref >60)
GFR calc non Af Amer: >60 mL/min (ref >60)
GLUCOSE: 142 mg/dL — AB (ref 65–99)
POTASSIUM: 3.9 mmol/L (ref 3.5–5.1)
Sodium: 139 mmol/L (ref 135–145)
TOTAL PROTEIN: 7.8 g/dL (ref 6.5–8.1)
Total Bilirubin: 0.4 mg/dL (ref 0.3–1.2)

## 2014-09-27 LAB — CBC WITH DIFFERENTIAL/PLATELET
BASOS ABS: 0.1 10*3/uL (ref 0–0.1)
Basophils Relative: 1 %
Eosinophils Absolute: 0.1 10*3/uL (ref 0–0.7)
Eosinophils Relative: 1 %
HCT: 40.9 % (ref 40.0–52.0)
HEMOGLOBIN: 13.6 g/dL (ref 13.0–18.0)
Lymphocytes Relative: 8 %
Lymphs Abs: 0.8 10*3/uL — ABNORMAL LOW (ref 1.0–3.6)
MCH: 28.6 pg (ref 26.0–34.0)
MCHC: 33.2 g/dL (ref 32.0–36.0)
MCV: 86 fL (ref 80.0–100.0)
MONO ABS: 0.4 10*3/uL (ref 0.2–1.0)
Monocytes Relative: 4 %
NEUTROS ABS: 9.2 10*3/uL — AB (ref 1.4–6.5)
Neutrophils Relative %: 86 %
Platelets: 237 10*3/uL (ref 150–440)
RBC: 4.76 MIL/uL (ref 4.40–5.90)
RDW: 14.6 % — ABNORMAL HIGH (ref 11.5–14.5)
WBC: 10.7 10*3/uL — ABNORMAL HIGH (ref 3.8–10.6)

## 2014-09-27 LAB — URINALYSIS COMPLETE WITH MICROSCOPIC (ARMC ONLY)
BILIRUBIN URINE: NEGATIVE
Bacteria, UA: NONE SEEN
Glucose, UA: NEGATIVE mg/dL
Ketones, ur: NEGATIVE mg/dL
Leukocytes, UA: NEGATIVE
NITRITE: NEGATIVE
PH: 6 (ref 5.0–8.0)
Protein, ur: NEGATIVE mg/dL
SQUAMOUS EPITHELIAL / LPF: NONE SEEN
Specific Gravity, Urine: 1.026 (ref 1.005–1.030)

## 2014-09-27 MED ORDER — ONDANSETRON HCL 4 MG/2ML IJ SOLN
4.0000 mg | Freq: Once | INTRAMUSCULAR | Status: AC
  Administered 2014-09-27: 4 mg via INTRAVENOUS
  Filled 2014-09-27: qty 2

## 2014-09-27 MED ORDER — PANTOPRAZOLE SODIUM 40 MG IV SOLR
40.0000 mg | Freq: Once | INTRAVENOUS | Status: AC
  Administered 2014-09-27: 40 mg via INTRAVENOUS
  Filled 2014-09-27: qty 40

## 2014-09-27 NOTE — ED Notes (Signed)
Pt had episode of vomit, dark red in color, Dr. Jimmye Andrew made aware

## 2014-09-27 NOTE — Assessment & Plan Note (Signed)
Tight with 37fr foley.

## 2014-09-27 NOTE — Consult Note (Signed)
Subjective: I was asked to see Mr Andrada by Dr. Jimmye Mitchael for foley placement.  The patient was sent from the ER after a failed foley attempt for retention at the nursing home.   ER staff was unable to pass a foley.  His bladder scan PVR is >579ml.  He has a prior history of renal cryoablation by Dr. Jacqlyn Larsen in 2012 or 2013 but no prostate surgery.   He reports he has had difficulty voiding for 3-4 days.   He has had no dysuria.  He is currently on tamsulosin.   ROS:  Review of Systems  Unable to perform ROS: mental acuity  Genitourinary:       Suprapubic pain    No Known Allergies  Past Medical History  Diagnosis Date  . COPD (chronic obstructive pulmonary disease)   . Chronic pain   . Contracture of left knee   . Hypertension   . Dysphagia   . Coronary artery disease   . Depressed   . S/P AKA (above knee amputation)     History reviewed. No pertinent past surgical history.  History   Social History  . Marital Status: Single    Spouse Name: N/A  . Number of Children: N/A  . Years of Education: N/A   Occupational History  . Not on file.   Social History Main Topics  . Smoking status: Former Research scientist (life sciences)  . Smokeless tobacco: Not on file  . Alcohol Use: Not on file  . Drug Use: Not on file  . Sexual Activity: Not on file   Other Topics Concern  . Not on file   Social History Narrative  . No narrative on file    History reviewed. No pertinent family history. PSFH reviewed.  Anti-infectives: Anti-infectives    None      No current facility-administered medications for this encounter.   Current Outpatient Prescriptions  Medication Sig Dispense Refill  . acetaminophen (TYLENOL) 500 MG tablet Take 1,000 mg by mouth 2 (two) times daily.    Marland Kitchen albuterol (PROVENTIL HFA;VENTOLIN HFA) 108 (90 BASE) MCG/ACT inhaler Inhale 2 puffs into the lungs 4 (four) times daily.    Marland Kitchen aspirin EC 81 MG tablet Take 81 mg by mouth daily.    Marland Kitchen doxazosin (CARDURA) 1 MG tablet Take 1 mg  by mouth daily.    Marland Kitchen escitalopram (LEXAPRO) 10 MG tablet Take 10 mg by mouth daily.    . ferrous sulfate 220 (44 FE) MG/5ML solution Take 330 mg by mouth daily.    . fluticasone (FLOVENT HFA) 44 MCG/ACT inhaler Inhale 1 puff into the lungs 2 (two) times daily.    . Hypromellose (ARTIFICIAL TEARS OP) Apply 1 drop to eye 3 (three) times daily. Pt uses at 6am, 2pm, and 10pm.    . ipratropium-albuterol (DUONEB) 0.5-2.5 (3) MG/3ML SOLN Take 3 mLs by nebulization every 6 (six) hours as needed (for wheezing/shortness of breath).    . methimazole (TAPAZOLE) 10 MG tablet Take 10 mg by mouth daily.    . polyethylene glycol (MIRALAX / GLYCOLAX) packet Take 17 g by mouth 2 (two) times daily.    . ranitidine (ZANTAC) 150 MG tablet Take 150 mg by mouth 2 (two) times daily.    . sucralfate (CARAFATE) 1 GM/10ML suspension Take 1 g by mouth 3 (three) times daily.    . tamsulosin (FLOMAX) 0.4 MG CAPS capsule Take 0.4 mg by mouth daily.    Marland Kitchen tiotropium (SPIRIVA) 18 MCG inhalation capsule Place 18 mcg into inhaler  and inhale daily.    . traMADol (ULTRAM) 50 MG tablet Take 50 mg by mouth 2 (two) times daily.    . Vitamin D, Ergocalciferol, (DRISDOL) 50000 UNITS CAPS capsule Take 50,000 Units by mouth every 30 (thirty) days.       Objective: Vital signs in last 24 hours: Temp:  [98.7 F (37.1 C)] 98.7 F (37.1 C) (07/25 1853) Pulse Rate:  [85-103] 85 (07/25 1900) Resp:  [22] 22 (07/25 1853) BP: (158-159)/(90-145) 158/90 mmHg (07/25 1900) SpO2:  [95 %-96 %] 96 % (07/25 1900) Weight:  [83.462 kg (184 lb)] 83.462 kg (184 lb) (07/25 1853)  Intake/Output from previous day:   Intake/Output this shift:     Physical Exam  Constitutional: He is oriented to person, place, and time and well-developed, well-nourished, and in no distress.  Cardiovascular: Normal rate and regular rhythm.   Pulmonary/Chest: Effort normal. No respiratory distress.  Abdominal: Soft. There is tenderness.  obese  Genitourinary:   Buried phallus but normal meatus.  Scrotum and testes normal  Neurological: He is alert and oriented to person, place, and time.  Skin: Skin is warm and dry.    Lab Results:   Recent Labs  09/27/14 1907  WBC 10.7*  HGB 13.6  HCT 40.9  PLT 237   BMET  Recent Labs  09/27/14 1907  NA 139  K 3.9  CL 103  CO2 27  GLUCOSE 142*  BUN 14  CREATININE 0.86  CALCIUM 9.1   PT/INR No results for input(s): LABPROT, INR in the last 72 hours. ABG No results for input(s): PHART, HCO3 in the last 72 hours.  Invalid input(s): PCO2, PO2  Studies/Results: No results found.  I attempted to place an 23fr coude after a prep and urethral lubrication but was unable.  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  The foley was placed to drainage. The urine is turbid. 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    He will need foley drainage with outpatient urology f/u with [facility].  Urethral stricture Tight with 45fr foley.      CC: Dr.Jonathon Marliss Czar 09/27/2014

## 2014-09-27 NOTE — ED Notes (Signed)
Pt bladder scan >522, attempted to insert foley with no urine return, MD notified, Dr. Jimmye Xachary attempted, foley removed, urology paged

## 2014-09-27 NOTE — Assessment & Plan Note (Signed)
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    He will need foley drainage with outpatient urology f/u with [facility].

## 2014-09-27 NOTE — ED Notes (Addendum)
Pt arrives via EMS from [facility], staff states pt has had no urine output for 2 1/2 days, pt has a AKA on the right leg and contracted on the left leg, pt awake and alert during assessment, abdomen distended and tender

## 2014-09-27 NOTE — ED Provider Notes (Addendum)
[facility] Emergency Department Provider Note     Time seen: ----------------------------------------- 7:00 PM on 09/27/2014 -----------------------------------------    I have reviewed the triage vital signs and the nursing notes.   HISTORY  Chief Complaint Urinary Retention    HPI Brent Ryan is a 74 y.o. male sent from [facility] with inability to urinate for the last 2-1/2 days. Coronary reports attempt was made to place a Foley catheter without any success. Patient is complaining of some pelvic pain, also complaining of some nausea. Pain is mild, nothing makes it better or worse. Denies fevers chills or other complaints.   Past Medical History  Diagnosis Date  . COPD (chronic obstructive pulmonary disease)   . Chronic pain   . Contracture of left knee   . Hypertension   . Dysphagia   . Coronary artery disease   . Depressed   . S/P AKA (above knee amputation)     There are no active problems to display for this patient.   History reviewed. No pertinent past surgical history.  Allergies Review of patient's allergies indicates no known allergies.  Social History History  Substance Use Topics  . Smoking status: Former Research scientist (life sciences)  . Smokeless tobacco: Not on file  . Alcohol Use: Not on file    Review of Systems Constitutional: Negative for fever. Eyes: Negative for visual changes. ENT: Negative for sore throat. Cardiovascular: Negative for chest pain. Respiratory: Negative for shortness of breath. Gastrointestinal: Negative for abdominal pain, positive for nausea Genitourinary: Positive for diminished urine output Musculoskeletal: Negative for back pain. Skin: Negative for rash. Neurological: Negative for headaches, focal weakness or numbness.  10-point ROS otherwise negative.  ____________________________________________   PHYSICAL EXAM:  VITAL SIGNS: ED Triage Vitals  Enc Vitals Group     BP 09/27/14 1853 159/145  mmHg     Pulse Rate 09/27/14 1853 103     Resp 09/27/14 1853 22     Temp 09/27/14 1853 98.7 F (37.1 C)     Temp Source 09/27/14 1853 Oral     SpO2 09/27/14 1853 95 %     Weight 09/27/14 1853 184 lb (83.462 kg)     Height 09/27/14 1853 5\' 9"  (1.753 m)     Head Cir --      Peak Flow --      Pain Score 09/27/14 1854 6     Pain Loc --      Pain Edu? --      Excl. in [place]? --     Constitutional: Alert and oriented. Well appearing and in no distress. Eyes: Conjunctivae are normal. PERRL. Normal extraocular movements. ENT   Head: Normocephalic and atraumatic.   Nose: No congestion/rhinnorhea.   Mouth/Throat: Mucous membranes are moist.   Neck: No stridor. Cardiovascular: Normal rate, regular rhythm. Normal and symmetric distal pulses are present in all extremities. No murmurs, rubs, or gallops. Respiratory: Normal respiratory effort without tachypnea nor retractions. Breath sounds are clear and equal bilaterally. No wheezes/rales/rhonchi. Gastrointestinal: Soft and nontender. No distention. No abdominal bruits. There is no CVA tenderness. Musculoskeletal: Nontender with normal range of motion in all extremities. No joint effusions.  No lower extremity tenderness nor edema. Left AKA Neurologic:  Normal speech and language. No gross focal neurologic deficits are appreciated.  Skin:  Skin is warm, dry and intact. No rash noted. Psychiatric: Mood and affect are normal. Speech and behavior are normal. Patient exhibits appropriate insight and judgment. ____________________________________________  ED COURSE:  Pertinent labs &  imaging results that were available during my care of the patient were reviewed by me and considered in my medical decision making (see chart for details). We'll check a bladder scan, Likely Place Foley catheter  Unable to pass Foley catheter, this after several tries a lot of manner as well including a daily catheter. Will discuss with urology. Greater than  500 cc's on bladder scan ____________________________________________    LABS (pertinent positives/negatives)  Labs Reviewed  CBC WITH DIFFERENTIAL/PLATELET - Abnormal; Notable for the following:    WBC 10.7 (*)    RDW 14.6 (*)    Neutro Abs 9.2 (*)    Lymphs Abs 0.8 (*)    All other components within normal limits  COMPREHENSIVE METABOLIC PANEL - Abnormal; Notable for the following:    Glucose, Bld 142 (*)    ALT 9 (*)    All other components within normal limits  URINALYSIS COMPLETEWITH MICROSCOPIC ([facility] ONLY) - Abnormal; Notable for the following:    Color, Urine YELLOW (*)    APPearance CLEAR (*)    Hgb urine dipstick 2+ (*)    All other components within normal limits   ____________________________________________  FINAL ASSESSMENT AND PLAN  Acute urinary retention, hematemesis  Plan: Patient with labs and imaging as dictated above. Foley catheter was placed by urology. Labs look surprisingly good here. However during his ER stay he started having copious hematemesis. Was given some IV Protonix and Zofran. It was gastric occult positive. He is being admitted  for further monitoring. Currently only takes a baby aspirin daily.   Earleen Newport, MD   Earleen Newport, MD 09/27/14 2050  Earleen Newport, MD 09/27/14 [phone]

## 2014-09-28 ENCOUNTER — Other Ambulatory Visit: Payer: Self-pay | Admitting: Gastroenterology

## 2014-09-28 DIAGNOSIS — K920 Hematemesis: Principal | ICD-10-CM | POA: Diagnosis present

## 2014-09-28 DIAGNOSIS — N359 Urethral stricture, unspecified: Secondary | ICD-10-CM | POA: Diagnosis present

## 2014-09-28 DIAGNOSIS — F329 Major depressive disorder, single episode, unspecified: Secondary | ICD-10-CM | POA: Diagnosis present

## 2014-09-28 DIAGNOSIS — K31819 Angiodysplasia of stomach and duodenum without bleeding: Secondary | ICD-10-CM | POA: Diagnosis present

## 2014-09-28 DIAGNOSIS — Z7982 Long term (current) use of aspirin: Secondary | ICD-10-CM | POA: Diagnosis not present

## 2014-09-28 DIAGNOSIS — I2510 Atherosclerotic heart disease of native coronary artery without angina pectoris: Secondary | ICD-10-CM | POA: Diagnosis present

## 2014-09-28 DIAGNOSIS — J449 Chronic obstructive pulmonary disease, unspecified: Secondary | ICD-10-CM | POA: Diagnosis present

## 2014-09-28 DIAGNOSIS — R102 Pelvic and perineal pain: Secondary | ICD-10-CM | POA: Diagnosis present

## 2014-09-28 DIAGNOSIS — Z87891 Personal history of nicotine dependence: Secondary | ICD-10-CM | POA: Diagnosis not present

## 2014-09-28 DIAGNOSIS — K317 Polyp of stomach and duodenum: Secondary | ICD-10-CM | POA: Diagnosis present

## 2014-09-28 DIAGNOSIS — R110 Nausea: Secondary | ICD-10-CM | POA: Diagnosis present

## 2014-09-28 DIAGNOSIS — R339 Retention of urine, unspecified: Secondary | ICD-10-CM | POA: Diagnosis present

## 2014-09-28 DIAGNOSIS — I10 Essential (primary) hypertension: Secondary | ICD-10-CM | POA: Diagnosis present

## 2014-09-28 DIAGNOSIS — Z8249 Family history of ischemic heart disease and other diseases of the circulatory system: Secondary | ICD-10-CM | POA: Diagnosis not present

## 2014-09-28 DIAGNOSIS — Z89611 Acquired absence of right leg above knee: Secondary | ICD-10-CM | POA: Diagnosis not present

## 2014-09-28 LAB — CBC
HEMATOCRIT: 39.9 % — AB (ref 40.0–52.0)
HEMOGLOBIN: 13 g/dL (ref 13.0–18.0)
MCH: 28.2 pg (ref 26.0–34.0)
MCHC: 32.5 g/dL (ref 32.0–36.0)
MCV: 86.7 fL (ref 80.0–100.0)
PLATELETS: 229 10*3/uL (ref 150–440)
RBC: 4.6 MIL/uL (ref 4.40–5.90)
RDW: 15 % — ABNORMAL HIGH (ref 11.5–14.5)
WBC: 11.4 10*3/uL — AB (ref 3.8–10.6)

## 2014-09-28 LAB — HEMOGLOBIN AND HEMATOCRIT, BLOOD
HEMATOCRIT: 36.2 % — AB (ref 40.0–52.0)
HEMATOCRIT: 37.2 % — AB (ref 40.0–52.0)
Hemoglobin: 12.1 g/dL — ABNORMAL LOW (ref 13.0–18.0)
Hemoglobin: 12.1 g/dL — ABNORMAL LOW (ref 13.0–18.0)

## 2014-09-28 LAB — BASIC METABOLIC PANEL
ANION GAP: 8 (ref 5–15)
BUN: 15 mg/dL (ref 6–20)
CO2: 25 mmol/L (ref 22–32)
CREATININE: 0.89 mg/dL (ref 0.61–1.24)
Calcium: 8.7 mg/dL — ABNORMAL LOW (ref 8.9–10.3)
Chloride: 106 mmol/L (ref 101–111)
GFR calc Af Amer: >60 mL/min (ref >60)
GLUCOSE: 90 mg/dL (ref 65–99)
Potassium: 3.9 mmol/L (ref 3.5–5.1)
Sodium: 139 mmol/L (ref 135–145)

## 2014-09-28 LAB — MRSA PCR SCREENING: MRSA BY PCR: NEGATIVE

## 2014-09-28 LAB — TSH: TSH: 2.97 u[IU]/mL (ref 0.350–4.500)

## 2014-09-28 LAB — PROTIME-INR
INR: 0.78
Prothrombin Time: 11.1 seconds — ABNORMAL LOW (ref 11.4–15.0)

## 2014-09-28 MED ORDER — ARTIFICIAL TEARS OP OINT
TOPICAL_OINTMENT | Freq: Three times a day (TID) | OPHTHALMIC | Status: DC
  Administered 2014-09-28 – 2014-09-30 (×6): via OPHTHALMIC
  Filled 2014-09-28 (×2): qty 3.5

## 2014-09-28 MED ORDER — ACETAMINOPHEN 325 MG PO TABS: 650.0000 mg | ORAL_TABLET | Freq: Four times a day (QID) | ORAL | Status: DC | PRN

## 2014-09-28 MED ORDER — ESCITALOPRAM OXALATE 10 MG PO TABS
10.0000 mg | ORAL_TABLET | Freq: Every day | ORAL | Status: DC
  Administered 2014-09-28 – 2014-09-30 (×2): 10 mg via ORAL
  Filled 2014-09-28 (×2): qty 1

## 2014-09-28 MED ORDER — PANTOPRAZOLE SODIUM 40 MG IV SOLR
40.0000 mg | Freq: Two times a day (BID) | INTRAVENOUS | Status: DC
  Administered 2014-09-28 – 2014-09-30 (×5): 40 mg via INTRAVENOUS
  Filled 2014-09-28 (×5): qty 40

## 2014-09-28 MED ORDER — METHIMAZOLE 10 MG PO TABS
10.0000 mg | ORAL_TABLET | Freq: Every day | ORAL | Status: DC
  Administered 2014-09-28 – 2014-09-30 (×2): 10 mg via ORAL
  Filled 2014-09-28 (×2): qty 1

## 2014-09-28 MED ORDER — KCL IN DEXTROSE-NACL 20-5-0.9 MEQ/L-%-% IV SOLN
INTRAVENOUS | Status: DC
  Administered 2014-09-28: 02:00:00 via INTRAVENOUS
  Filled 2014-09-28: qty 1000

## 2014-09-28 MED ORDER — TIOTROPIUM BROMIDE MONOHYDRATE 18 MCG IN CAPS
18.0000 ug | ORAL_CAPSULE | Freq: Every day | RESPIRATORY_TRACT | Status: DC
  Administered 2014-09-28 – 2014-09-30 (×2): 18 ug via RESPIRATORY_TRACT
  Filled 2014-09-28: qty 5

## 2014-09-28 MED ORDER — TAMSULOSIN HCL 0.4 MG PO CAPS
0.4000 mg | ORAL_CAPSULE | Freq: Every day | ORAL | Status: DC
  Administered 2014-09-28 – 2014-09-30 (×2): 0.4 mg via ORAL
  Filled 2014-09-28 (×2): qty 1

## 2014-09-28 MED ORDER — SUCRALFATE 1 GM/10ML PO SUSP
1.0000 g | Freq: Three times a day (TID) | ORAL | Status: DC
  Administered 2014-09-28 – 2014-09-30 (×6): 1 g via ORAL
  Filled 2014-09-28 (×6): qty 10

## 2014-09-28 MED ORDER — ACETAMINOPHEN 650 MG RE SUPP
650.0000 mg | Freq: Four times a day (QID) | RECTAL | Status: DC | PRN
  Administered 2014-09-29: 650 mg via RECTAL
  Filled 2014-09-28: qty 1

## 2014-09-28 MED ORDER — ALBUTEROL SULFATE (2.5 MG/3ML) 0.083% IN NEBU
3.0000 mL | INHALATION_SOLUTION | Freq: Four times a day (QID) | RESPIRATORY_TRACT | Status: DC
  Administered 2014-09-28 – 2014-09-30 (×10): 3 mL via RESPIRATORY_TRACT
  Filled 2014-09-28 (×10): qty 3

## 2014-09-28 MED ORDER — FLUTICASONE PROPIONATE HFA 44 MCG/ACT IN AERO
1.0000 | INHALATION_SPRAY | Freq: Two times a day (BID) | RESPIRATORY_TRACT | Status: DC
  Administered 2014-09-28 – 2014-09-30 (×5): 1 via RESPIRATORY_TRACT
  Filled 2014-09-28: qty 10.6

## 2014-09-28 MED ORDER — ONDANSETRON HCL 4 MG/2ML IJ SOLN: 4.0000 mg | Freq: Four times a day (QID) | INTRAMUSCULAR | Status: DC | PRN

## 2014-09-28 MED ORDER — ONDANSETRON HCL 4 MG PO TABS: 4.0000 mg | ORAL_TABLET | Freq: Four times a day (QID) | ORAL | Status: DC | PRN

## 2014-09-28 MED ORDER — IPRATROPIUM-ALBUTEROL 0.5-2.5 (3) MG/3ML IN SOLN: 3.0000 mL | Freq: Four times a day (QID) | RESPIRATORY_TRACT | Status: DC | PRN

## 2014-09-28 NOTE — Clinical Social Work Note (Signed)
Clinical Social Work Assessment  Patient Details  Name: Brent Ryan MRN: [id number] Date of Birth: 03-Feb-1941  Date of referral:  09/28/14               Reason for consult:  Facility Placement, Other (Comment Required) (From [facility] (SNF) )                Permission sought to share information with:  Chartered certified accountant granted to share information::  Yes, Verbal Permission Granted  Name::      Investment banker, operational::   [employer]  Relationship::   Admissions Administrator, arts Information:     Housing/Transportation Living arrangements for the past 2 months:  [place] of Information:  Patient, Other (Comment Required) 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 ) Patient Interpreter Needed:  None Criminal Activity/Legal Involvement Pertinent to Current Situation/Hospitalization:  No - Comment as needed Significant Relationships:  None Lives with:  Facility Resident Do you feel safe going back to the place where you live?  Yes Need for family participation in patient care:  No (Coment)  Care giving concerns: Patient is a long term care resident at [facility].    Social Worker assessment / plan: Holiday representative (CSW) received consult that patient is from a facility. CSW met with patient to address consult. CSW introduced self and explained role of CSW department. Patient was laying in the bed and was alert and oriented. Patient reported that he has lived at [facility] for 6 years. Patient reported that he has no family or friends. Patient has a friend Brent Ryan listed on his face sheet. Per patient he is no longer in contact with Brent Ryan because he got married and moved to a different state. Patient reported that he wants to go back to [facility] and is ready to return today. CSW contacted Armed forces operational officer at [facility]. Per Brent Ryan patient is a long term care resident and can return when stable. Per  Brent Ryan patient has no family however he had 1 son that passed away. Per Brent Ryan patient makes his own decisions. CSW will continue to follow and assist as needed.    Employment status:  Retired Forensic scientist:  [place], [employer] PT Recommendations:  Not assessed at this time [place] / Referral to community resources:  [place]  Patient/Family's Response to care:  Patient is agreeable to returning to [facility].   Patient/Family's Understanding of and Emotional Response to Diagnosis, Current Treatment, and Prognosis:  Patient was pleasant throughout assessment.   Emotional Assessment Appearance:  Appears stated age Attitude/Demeanor/Rapport:    Affect (typically observed):  Pleasant, Quiet Orientation:  Oriented to Self, Oriented to Place, Oriented to  Time, Oriented to Situation Alcohol / Substance use:  Not Applicable Psych involvement (Current and /or in the community):  No (Comment)  Discharge Needs  Concerns to be addressed:  Discharge Planning Concerns Readmission within the last 30 days:  No Current discharge risk:  Chronically ill Barriers to Discharge:  Continued Medical Work up   Brent Freshwater, LCSW 09/28/2014, 12:09 PM

## 2014-09-28 NOTE — Consult Note (Addendum)
[facility]  [address], [place], [place] [address] Phone: [phone] Fax : [phone]  Consultation  Referring Provider:     No ref. provider found Primary Care Physician:  No primary care provider on file. Primary Gastroenterologist:   Reason for Consultation:     Hematemesis  Date of Admission:  09/27/2014 Date of Consultation:  09/28/2014         HPI:   Brent Ryan is a 74 y.o. male who came in with a history of coronary artery disease COPD hypertension and depression who lives at a nursing home and was found to have abdominal distention and acute urinary retention. The patient had a Foley catheter placed and was being sent home when he had a episode of vomiting blood. The patient denies remembering that he vomited blood. The patient does note that it is 2016 and where he has but does not know the month he was off by 1 and reported to be June. He denies ever being told that he had GI problems in the past and states that he does not recall vomiting in the past. The patient's hemoglobin has remained stable and he has had no reported episodes of vomiting since being admitted. The patient states that he has some suprapubic pain at the present time but no nausea or upper abdominal pain present time. There is no report of any black stools or bloody stools.  Past Medical History  Diagnosis Date  . COPD (chronic obstructive pulmonary disease)   . Chronic pain   . Contracture of left knee   . Hypertension   . Dysphagia   . Coronary artery disease   . Depressed   . S/P AKA (above knee amputation)     History reviewed. No pertinent past surgical history.  Prior to Admission medications   Medication Sig Start Date End Date Taking? Authorizing Provider  acetaminophen (TYLENOL) 500 MG tablet Take 1,000 mg by mouth 2 (two) times daily.   Yes Historical Provider, MD  albuterol (PROVENTIL HFA;VENTOLIN HFA) 108 (90 BASE) MCG/ACT inhaler Inhale 2 puffs into the lungs 4  (four) times daily.   Yes Historical Provider, MD  aspirin EC 81 MG tablet Take 81 mg by mouth daily.   Yes Historical Provider, MD  doxazosin (CARDURA) 1 MG tablet Take 1 mg by mouth daily.   Yes Historical Provider, MD  escitalopram (LEXAPRO) 10 MG tablet Take 10 mg by mouth daily.   Yes Historical Provider, MD  ferrous sulfate 220 (44 FE) MG/5ML solution Take 330 mg by mouth daily.   Yes Historical Provider, MD  fluticasone (FLOVENT HFA) 44 MCG/ACT inhaler Inhale 1 puff into the lungs 2 (two) times daily.   Yes Historical Provider, MD  Hypromellose (ARTIFICIAL TEARS OP) Apply 1 drop to eye 3 (three) times daily. Pt uses at 6am, 2pm, and 10pm.   Yes Historical Provider, MD  ipratropium-albuterol (DUONEB) 0.5-2.5 (3) MG/3ML SOLN Take 3 mLs by nebulization every 6 (six) hours as needed (for wheezing/shortness of breath).   Yes Historical Provider, MD  methimazole (TAPAZOLE) 10 MG tablet Take 10 mg by mouth daily.   Yes Historical Provider, MD  polyethylene glycol (MIRALAX / GLYCOLAX) packet Take 17 g by mouth 2 (two) times daily.   Yes Historical Provider, MD  ranitidine (ZANTAC) 150 MG tablet Take 150 mg by mouth 2 (two) times daily.   Yes Historical Provider, MD  sennosides-docusate sodium (SENOKOT-S) 8.6-50 MG tablet Take 1 tablet by mouth at bedtime.   Yes  Historical Provider, MD  sucralfate (CARAFATE) 1 GM/10ML suspension Take 1 g by mouth 3 (three) times daily.   Yes Historical Provider, MD  tamsulosin (FLOMAX) 0.4 MG CAPS capsule Take 0.4 mg by mouth daily.   Yes Historical Provider, MD  tiotropium (SPIRIVA) 18 MCG inhalation capsule Place 18 mcg into inhaler and inhale daily.   Yes Historical Provider, MD  traMADol (ULTRAM) 50 MG tablet Take 50 mg by mouth 2 (two) times daily.   Yes Historical Provider, MD  Vitamin D, Ergocalciferol, (DRISDOL) 50000 UNITS CAPS capsule Take 50,000 Units by mouth every 30 (thirty) days.   Yes Historical Provider, MD    History reviewed. No pertinent family  history.   History  Substance Use Topics  . Smoking status: Former Research scientist (life sciences)  . Smokeless tobacco: Not on file  . Alcohol Use: Not on file    Allergies as of 09/27/2014  . (No Known Allergies)    Review of Systems:    All systems reviewed and negative except where noted in HPI.   Physical Exam:  Vital signs in last 24 hours: Temp:  [98.4 F (36.9 C)-98.7 F (37.1 C)] 98.7 F (37.1 C) (07/26 0826) Pulse Rate:  [72-105] 92 (07/26 0826) Resp:  [16-22] 18 (07/26 0826) BP: (113-196)/(72-163) 145/72 mmHg (07/26 0826) SpO2:  [92 %-98 %] 92 % (07/26 0826) Weight:  [182 lb 1.6 oz (82.6 kg)-184 lb (83.462 kg)] 182 lb 1.6 oz (82.6 kg) (07/26 0108) Last BM Date: 09/27/14 General:   Pleasant, cooperative in NAD, obese Head:  Normocephalic and atraumatic. Eyes:   No icterus.   Conjunctiva pink. PERRLA. Ears:  Normal auditory acuity. Neck:  Supple; no masses or thyroidomegaly Lungs: Respirations even and unlabored. Lungs clear to auscultation bilaterally.   No wheezes, crackles, or rhonchi.  Heart:  Regular rate and rhythm;  Without murmur, clicks, rubs or gallops Abdomen:  Soft, nondistended, nontender. Normal bowel sounds. No appreciable masses or hepatomegaly.  No rebound or guarding.  Rectal:  Not performed. Msk:  Symmetrical without gross deformities.  Strength  Extremities:  Without edema, cyanosis or clubbing on left with AKA on right. Neurologic:  Alert and oriented x2;  grossly normal neurologically. Skin:  Intact without significant lesions or rashes. Cervical Nodes:  No significant cervical adenopathy. Psych:  Alert and cooperative. Normal affect.  LAB RESULTS:  Recent Labs  09/27/14 1907 09/28/14 0123 09/28/14 0727  WBC 10.7* 11.4*  --   HGB 13.6 13.0 12.1*  HCT 40.9 39.9* 36.2*  PLT 237 229  --    BMET  Recent Labs  09/27/14 1907 09/28/14 0123  NA 139 139  K 3.9 3.9  CL 103 106  CO2 27 25  GLUCOSE 142* 90  BUN 14 15  CREATININE 0.86 0.89  CALCIUM 9.1  8.7*   LFT  Recent Labs  09/27/14 1907  PROT 7.8  ALBUMIN 3.7  AST 15  ALT 9*  ALKPHOS 95  BILITOT 0.4   PT/INR  Recent Labs  09/28/14 0123  LABPROT 11.1*  INR 0.78    STUDIES: No results found.    Impression / Plan:   Brent Ryan is a 74 y.o. y/o male with who was admitted with urinary retention and had a episode of hematemesis prior to discharge. The patient was admitted for further evaluation of this. The patient denies any further episodes of hematemesis since being admitted. The patient will be put on a clear liquid diet for today and set up for an EGD for tomorrow. The  patient has been explained the plan and agrees with it.  Thank you for involving me in the care of this patient.      LOS: 0 days   Ollen Bowl, MD  09/28/2014, 12:10 PM

## 2014-09-28 NOTE — Progress Notes (Signed)
[place] at [place] NAME: [place] Ryan    MR#:  [id number]  DATE OF BIRTH:  January 15, 1941  SUBJECTIVE:  Feels better today came in with urinary retention and hemetemesis  REVIEW OF SYSTEMS:   Review of Systems  Constitutional: Negative for fever, chills and weight loss.  HENT: Negative for ear discharge, ear pain and nosebleeds.   Eyes: Negative for blurred vision, pain and discharge.  Respiratory: Negative for sputum production, shortness of breath, wheezing and stridor.   Cardiovascular: Negative for chest pain, palpitations, orthopnea and PND.  Gastrointestinal: Negative for nausea, vomiting, abdominal pain and diarrhea.  Genitourinary: Negative for urgency and frequency.  Musculoskeletal: Negative for back pain and joint pain.  Neurological: Negative for sensory change, speech change, focal weakness and weakness.  Psychiatric/Behavioral: Negative for depression. The patient is not nervous/anxious.   All other systems reviewed and are negative.  Tolerating Diet:yes Tolerating PT: bed bound  DRUG ALLERGIES:  No Known Allergies  VITALS:  Blood pressure 145/72, pulse 92, temperature 98.7 F (37.1 C), temperature source Oral, resp. rate 18, height 5\' 9"  (1.753 m), weight 82.6 kg (182 lb 1.6 oz), SpO2 92 %.  PHYSICAL EXAMINATION:   Physical Exam  GENERAL:  74 y.o.-year-old patient lying in the bed with no acute distress.  EYES: Pupils equal, round, reactive to light and accommodation. No scleral icterus. Extraocular muscles intact.  HEENT: Head atraumatic, normocephalic. Oropharynx and nasopharynx clear.  NECK:  Supple, no jugular venous distention. No thyroid enlargement, no tenderness.  LUNGS: Normal breath sounds bilaterally, no wheezing, rales, rhonchi. No use of accessory muscles of respiration.  CARDIOVASCULAR: S1, S2 normal. No murmurs, rubs, or gallops.  ABDOMEN: Soft, nontender, nondistended. Bowel sounds present. No  organomegaly or mass.  EXTREMITIES: No cyanosis, clubbing or edema b/l.   Contractures+ NEUROLOGIC: Cranial nerves II through XII are intact. No focal neuro deficit PSYCHIATRIC: alert and oriented x 3.  SKIN: No obvious rash, lesion, or ulcer.    LABORATORY PANEL:   CBC  Recent Labs Lab 09/28/14 0123 09/28/14 0727  WBC 11.4*  --   HGB 13.0 12.1*  HCT 39.9* 36.2*  PLT 229  --     Chemistries   Recent Labs Lab 09/27/14 1907 09/28/14 0123  NA 139 139  K 3.9 3.9  CL 103 106  CO2 27 25  GLUCOSE 142* 90  BUN 14 15  CREATININE 0.86 0.89  CALCIUM 9.1 8.7*  AST 15  --   ALT 9*  --   ALKPHOS 95  --   BILITOT 0.4  --    ASSESSMENT AND PLAN:   Brent Ryan is a 74 y.o. male with a known history of coronary artery disease, COPD, hypertension and depression is presenting to the ED from [facility] for abdominal distention from acute urinary retention for the past 2.5 days. ED staff were unsuccessful to place a Foley catheter urology consult was placed and Foley catheter was placed with urology, who has recommended the patient to follow up with [facility] neurology as an outpatient. Patient was about to be discharged from the ED but he started vomiting blood.  1. Hematemesis CLD IV fluid hydration Monitor hemoglobin and hematocrit every 6 hours Provide blood transfusion as needed basis GI prophylaxis with Protonix IV Gastrointestinal the consult with Dr Allen Norris noted-EGD in am  2. 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 Chronic history of hypertension Currently patient is nothing by mouth, holding his home medications We will provide IV Lopressor when necessary basis for elevated blood pressures  4. Coronary artery disease Currently patient is asymptomatic  5. Chronic history of COPD No acute exacerbation at this  time. Continue inhalers and provide nebulizer treatments as needed basis  6.DVT prophylaxis with SCDs  Case discussed with Care Management/Social Worker. Management plans discussed with the patient and in agreement.  CODE STATUS: Full  DVT Prophylaxis:   TOTAL TIME TAKING CARE OF THIS PATIENT: 40 minutes.  >50% time spent on counselling and coordination of care  POSSIBLE D/C IN 1-2 DAYS, DEPENDING ON CLINICAL CONDITION.   Telesia Ates M.D on 09/28/2014 at 1:14 PM  Between 7am to 6pm - Pager - [phone]  After 6pm go to [email or web address] - password [email or web address] [facility]  [place] [facility]  Office  [phone]  CC: Primary care physician; No primary care provider on file.

## 2014-09-28 NOTE — H&P (Signed)
[place] at [place] NAME: [place] Ryan    MR#:  [id number]  DATE OF BIRTH:  [birth year]  DATE OF ADMISSION:  09/27/2014  PRIMARY CARE PHYSICIAN: No primary care provider on file.   REQUESTING/REFERRING PHYSICIAN: Dr. Jimmye Xane  CHIEF COMPLAINT:   Acute urinary retention HISTORY OF PRESENT ILLNESS:  Brent Ryan  is a 74 y.o. male with a known history of coronary artery disease, COPD, hypertension and depression is presenting to the ED from [facility] for abdominal distention from acute urinary retention for the past 2.5 days. ED staff were unsuccessful to place a Foley catheter urology consult was placed and Foley catheter was placed with urology, who has recommended the patient to follow up with [facility] neurology as an outpatient. Patient was about to be discharged from the ED but he started vomiting blood. Hospitalist team is called to admit the patient for hematemesis. Patient denies any similar complaints in the past. Denies any abdominal pain following foley catheter placement. Denies any diarrhea.  PAST MEDICAL HISTORY:   Past Medical History  Diagnosis Date  . COPD (chronic obstructive pulmonary disease)   . Chronic pain   . Contracture of left knee   . Hypertension   . Dysphagia   . Coronary artery disease   . Depressed   . S/P AKA (above knee amputation)     PAST SURGICAL HISTOIRY:  History reviewed. No pertinent past surgical history.  SOCIAL HISTORY:   History  Substance Use Topics  . Smoking status: Former Research scientist (life sciences)  . Smokeless tobacco: Not on file  . Alcohol Use: Not on file    FAMILY HISTORY:  Hypertension and coronary artery disease   DRUG ALLERGIES:  No Known Allergies  REVIEW OF SYSTEMS:  CONSTITUTIONAL: No fever, fatigue or weakness.  EYES: No blurred or double vision.  EARS, NOSE, AND THROAT: No tinnitus or ear pain.  RESPIRATORY: No cough, shortness of breath, wheezing or hemoptysis.   CARDIOVASCULAR: No chest pain, orthopnea, edema.  GASTROINTESTINAL: No nausea, vomiting, reporting hematemesis, denies  diarrhea or abdominal pain.  GENITOURINARY: No dysuria, hematuria.  ENDOCRINE: No polyuria, nocturia,  HEMATOLOGY: No anemia, easy bruising or bleeding SKIN: No rash or lesion. MUSCULOSKELETAL: No joint pain or arthritis.   NEUROLOGIC: No tingling, numbness, weakness.  PSYCHIATRY: No anxiety or depression.   MEDICATIONS AT HOME:   Prior to Admission medications   Medication Sig Start Date End Date Taking? Authorizing Provider  acetaminophen (TYLENOL) 500 MG tablet Take 1,000 mg by mouth 2 (two) times daily.   Yes Historical Provider, MD  albuterol (PROVENTIL HFA;VENTOLIN HFA) 108 (90 BASE) MCG/ACT inhaler Inhale 2 puffs into the lungs 4 (four) times daily.   Yes Historical Provider, MD  aspirin EC 81 MG tablet Take 81 mg by mouth daily.   Yes Historical Provider, MD  doxazosin (CARDURA) 1 MG tablet Take 1 mg by mouth daily.   Yes Historical Provider, MD  escitalopram (LEXAPRO) 10 MG tablet Take 10 mg by mouth daily.   Yes Historical Provider, MD  ferrous sulfate 220 (44 FE) MG/5ML solution Take 330 mg by mouth daily.   Yes Historical Provider, MD  fluticasone (FLOVENT HFA) 44 MCG/ACT inhaler Inhale 1 puff into the lungs 2 (two) times daily.   Yes Historical Provider, MD  Hypromellose (ARTIFICIAL TEARS OP) Apply 1 drop to eye 3 (three) times daily. Pt uses at 6am, 2pm, and 10pm.   Yes Historical Provider, MD  ipratropium-albuterol (DUONEB) 0.5-2.5 (3)  MG/3ML SOLN Take 3 mLs by nebulization every 6 (six) hours as needed (for wheezing/shortness of breath).   Yes Historical Provider, MD  methimazole (TAPAZOLE) 10 MG tablet Take 10 mg by mouth daily.   Yes Historical Provider, MD  polyethylene glycol (MIRALAX / GLYCOLAX) packet Take 17 g by mouth 2 (two) times daily.   Yes Historical Provider, MD  ranitidine (ZANTAC) 150 MG tablet Take 150 mg by mouth 2 (two) times daily.    Yes Historical Provider, MD  sennosides-docusate sodium (SENOKOT-S) 8.6-50 MG tablet Take 1 tablet by mouth at bedtime.   Yes Historical Provider, MD  sucralfate (CARAFATE) 1 GM/10ML suspension Take 1 g by mouth 3 (three) times daily.   Yes Historical Provider, MD  tamsulosin (FLOMAX) 0.4 MG CAPS capsule Take 0.4 mg by mouth daily.   Yes Historical Provider, MD  tiotropium (SPIRIVA) 18 MCG inhalation capsule Place 18 mcg into inhaler and inhale daily.   Yes Historical Provider, MD  traMADol (ULTRAM) 50 MG tablet Take 50 mg by mouth 2 (two) times daily.   Yes Historical Provider, MD  Vitamin D, Ergocalciferol, (DRISDOL) 50000 UNITS CAPS capsule Take 50,000 Units by mouth every 30 (thirty) days.   Yes Historical Provider, MD      VITAL SIGNS:  Blood pressure 127/82, pulse 98, temperature 98.7 F (37.1 C), temperature source Oral, resp. rate 16, height 5\' 9"  (1.753 m), weight 83.462 kg (184 lb), SpO2 95 %.  PHYSICAL EXAMINATION:  GENERAL:  74 y.o.-year-old patient lying in the bed with no acute distress.  EYES: Pupils equal, round, reactive to light and accommodation. No scleral icterus. Extraocular muscles intact.  HEENT: Head atraumatic, normocephalic. Oropharynx and nasopharynx clear.  NECK:  Supple, no jugular venous distention. No thyroid enlargement, no tenderness.  LUNGS: Normal breath sounds bilaterally, no wheezing, rales,rhonchi or crepitation. No use of accessory muscles of respiration.  CARDIOVASCULAR: S1, S2 normal. No murmurs, rubs, or gallops.  ABDOMEN: Soft, nontender, nondistended. Bowel sounds present. No organomegaly or mass.  EXTREMITIES: No pedal edema, cyanosis, or clubbing.  NEUROLOGIC: Cranial nerves II through XII are intact. Muscle strength 5/5 in all extremities. Sensation intact. Gait not checked.  PSYCHIATRIC: The patient is alert and oriented x 3.  SKIN: No obvious rash, lesion, or ulcer.   LABORATORY PANEL:   CBC  Recent Labs Lab 09/27/14 1907  WBC 10.7*   HGB 13.6  HCT 40.9  PLT 237   ------------------------------------------------------------------------------------------------------------------  Chemistries   Recent Labs Lab 09/27/14 1907  NA 139  K 3.9  CL 103  CO2 27  GLUCOSE 142*  BUN 14  CREATININE 0.86  CALCIUM 9.1  AST 15  ALT 9*  ALKPHOS 95  BILITOT 0.4   ------------------------------------------------------------------------------------------------------------------  Cardiac Enzymes No results for input(s): TROPONINI in the last 168 hours. ------------------------------------------------------------------------------------------------------------------  RADIOLOGY:  No results found.  EKG:   Orders placed or performed in visit on 02/22/14  . EKG 12-Lead    IMPRESSION AND PLAN:   Brent Ryan  is a 74 y.o. male with a known history of coronary artery disease, COPD, hypertension and depression is presenting to the ED from [facility] for abdominal distention from acute urinary retention for the past 2.5 days. ED staff were unsuccessful to place a Foley catheter urology consult was placed and Foley catheter was placed with urology, who has recommended the patient to follow up with [facility] neurology as an outpatient. Patient was about to be discharged from the ED but he started vomiting blood.  1. Hematemesis  Nothing by mouth IV fluid hydration Monitor hemoglobin and hematocrit every 6 hours Provide blood transfusion as needed basis GI prophylaxis with Protonix IV Gastrointestinal the consult is placed  2. 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 Chronic history of hypertension Currently patient is nothing by mouth, holding his home medications We will provide IV Lopressor when necessary basis for elevated blood pressures  4.  Coronary artery disease Currently patient is asymptomatic  5. Chronic history of COPD No acute exacerbation at this time. Continue inhalers and provide nebulizer treatments as needed basis  DVT prophylaxis with SCDs    All the records are reviewed and case discussed with ED provider. Management plans discussed with the patient, and  he is in agreement.  CODE STATUS: Full code  TOTAL TIME TAKING CARE OF THIS PATIENT: Reviewing medical records, history and physical, admission orders and coordination of care-45 minutes.    Nicholes Mango M.D on 09/28/2014 at 12:57 AM  Between 7am to 6pm - Pager - [phone]  After 6pm go to [email or web address] - password [email or web address] [facility]  [place] [facility]  Office  [phone]  CC: Primary care physician; No primary care provider on file.

## 2014-09-28 NOTE — ED Notes (Signed)
Attempted to call report.  Floor states they will call this nurse back in a few minutes.  Receiving nurse not yet ready.

## 2014-09-29 ENCOUNTER — Encounter
Admission: EM | Disposition: A | Discharge status: Skilled Nursing Facility | Payer: Self-pay | Source: Home / Self Care | Attending: Internal Medicine

## 2014-09-29 ENCOUNTER — Inpatient Hospital Stay: Payer: Medicare Other | Admitting: Anesthesiology

## 2014-09-29 ENCOUNTER — Encounter: Payer: Self-pay | Admitting: Anesthesiology

## 2014-09-29 HISTORY — PX: ESOPHAGOGASTRODUODENOSCOPY: SHX5428

## 2014-09-29 LAB — PH, GASTRIC FLUID (GASTROCCULT CARD): pH, Gastric: 2

## 2014-09-29 SURGERY — EGD (ESOPHAGOGASTRODUODENOSCOPY)
Anesthesia: General

## 2014-09-29 SURGERY — ESOPHAGOGASTRODUODENOSCOPY (EGD) WITH PROPOFOL
Anesthesia: Monitor Anesthesia Care

## 2014-09-29 MED ORDER — HYDROCODONE-ACETAMINOPHEN 5-325 MG PO TABS
1.0000 | ORAL_TABLET | Freq: Four times a day (QID) | ORAL | Status: DC | PRN
  Administered 2014-09-30 (×2): 1 via ORAL
  Filled 2014-09-29 (×2): qty 1

## 2014-09-29 MED ORDER — MORPHINE SULFATE 2 MG/ML IJ SOLN
1.0000 mg | Freq: Once | INTRAMUSCULAR | Status: AC
  Administered 2014-09-29: 1 mg via INTRAVENOUS
  Filled 2014-09-29: qty 1

## 2014-09-29 MED ORDER — ONDANSETRON HCL 4 MG/2ML IJ SOLN: 4.0000 mg | Freq: Once | INTRAMUSCULAR | Status: AC | PRN

## 2014-09-29 MED ORDER — FENTANYL CITRATE (PF) 100 MCG/2ML IJ SOLN: 25.0000 ug | INTRAMUSCULAR | Status: DC | PRN

## 2014-09-29 MED ORDER — PROPOFOL 10 MG/ML IV BOLUS
INTRAVENOUS | Status: DC | PRN
  Administered 2014-09-29 (×9): 20 mg via INTRAVENOUS

## 2014-09-29 MED ORDER — SODIUM CHLORIDE 0.9 % IV SOLN
INTRAVENOUS | Status: DC
Start: 2014-09-29 — End: 2014-09-29
  Administered 2014-09-29: 14:00:00 via INTRAVENOUS

## 2014-09-29 MED ORDER — LIDOCAINE HCL (PF) 1 % IJ SOLN
INTRAMUSCULAR | Status: DC | PRN
  Administered 2014-09-29: 30 mg

## 2014-09-29 MED ORDER — SODIUM CHLORIDE 0.9 % IV SOLN: INTRAVENOUS | Status: DC

## 2014-09-29 MED ORDER — GLYCOPYRROLATE 0.2 MG/ML IJ SOLN
INTRAMUSCULAR | Status: DC | PRN
  Administered 2014-09-29: 0.2 mg via INTRAVENOUS

## 2014-09-29 NOTE — Anesthesia Preprocedure Evaluation (Addendum)
Anesthesia Evaluation  Patient identified by MRN, date of birth, ID band Patient awake    Reviewed: Allergy & Precautions, NPO status , Patient's Chart, lab work & pertinent test results  Airway Mallampati: II  TM Distance: >3 FB Neck ROM: Limited    Dental  (+) Edentulous Lower, Edentulous Upper   Pulmonary COPD COPD inhaler, former smoker,  breath sounds clear to auscultation  Pulmonary exam normal       Cardiovascular hypertension, + CAD Normal cardiovascular exam    Neuro/Psych Depression    GI/Hepatic Neg liver ROS, GERD-  Medicated and Controlled,dysphagia   Endo/Other  negative endocrine ROS  Renal/GU negative Renal ROS Bladder dysfunction      Musculoskeletal  (+) Arthritis -, Osteoarthritis,    Abdominal Normal abdominal exam  (+)   Peds negative pediatric ROS (+)  Hematology negative hematology ROS (+)   Anesthesia Other Findings BPH  Reproductive/Obstetrics                            Anesthesia Physical Anesthesia Plan  ASA: III  Anesthesia Plan: General   Post-op Pain Management:    Induction: Intravenous  Airway Management Planned: Nasal Cannula  Additional Equipment:   Intra-op Plan:   Post-operative Plan:   Informed Consent: I have reviewed the patients History and Physical, chart, labs and discussed the procedure including the risks, benefits and alternatives for the proposed anesthesia with the patient or authorized representative who has indicated his/her understanding and acceptance.   Dental advisory given  Plan Discussed with: CRNA and Surgeon  Anesthesia Plan Comments:         Anesthesia Quick Evaluation

## 2014-09-29 NOTE — Transfer of Care (Signed)
Immediate Anesthesia Transfer of Care Note  Patient: [place] Ryan  Procedure(s) Performed: Procedure(s): ESOPHAGOGASTRODUODENOSCOPY (EGD) (N/A)  Patient Location: PACU  Anesthesia Type:General  Level of Consciousness: sedated and responds to stimulation  Airway & Oxygen Therapy: Patient Spontanous Breathing and Patient connected to nasal cannula oxygen  Post-op Assessment: Report given to RN and Post -op Vital signs reviewed and stable  Post vital signs: Reviewed and stable  Last Vitals:  Filed Vitals:   09/29/14 1420  BP: 103/68  Pulse: 91  Temp: 36.2 C  Resp: 20    Complications: No apparent anesthesia complications

## 2014-09-29 NOTE — Plan of Care (Signed)
Problem: Phase I Progression Outcomes Goal: Pain controlled with appropriate interventions Outcome: Completed/Met Date Met:  09/29/14 Using tylenol for pain control

## 2014-09-29 NOTE — OR Nursing (Signed)
One clip placed on site where polyp removed

## 2014-09-29 NOTE — Progress Notes (Signed)
Patient can return to [facility] when medically stable. Clinical Social Worker (CSW) will continue to follow and assist as needed.   Blima Rich, [place] [phone]

## 2014-09-29 NOTE — Anesthesia Postprocedure Evaluation (Signed)
  Anesthesia Post-op Note  Patient: Brent Ryan  Procedure(s) Performed: Procedure(s): ESOPHAGOGASTRODUODENOSCOPY (EGD) (N/A)  Anesthesia type:General  Patient location: PACU  Post pain: Pain level controlled  Post assessment: Post-op Vital signs reviewed, Patient's Cardiovascular Status Stable, Respiratory Function Stable, Patent Airway and No signs of Nausea or vomiting  Post vital signs: Reviewed and stable  Last Vitals:  Filed Vitals:   09/29/14 1500  BP: 121/79  Pulse: 87  Temp:   Resp: 15    Level of consciousness: awake, alert  and patient cooperative  Complications: No apparent anesthesia complications

## 2014-09-29 NOTE — Progress Notes (Signed)
Patient complaining of bladder pain, Dr Manuella Ghazi notified, one time dose of morphine ordered, Norco ordered as needed. Continue to monitor.

## 2014-09-29 NOTE — Progress Notes (Signed)
[place] at [place] NAME: Brent Ryan    MR#:  [id number]  DATE OF BIRTH:  1940-06-11  SUBJECTIVE:  Feels better today came in with urinary retention and hematemesis. EGD showed single small angioectasia with no bleeding amd few polyps but no active bleeder, wanting his foley out but seems somewhat confused. REVIEW OF SYSTEMS:   Review of Systems  Constitutional: Negative for fever, chills and weight loss.  HENT: Negative for ear discharge, ear pain and nosebleeds.   Eyes: Negative for blurred vision, pain and discharge.  Respiratory: Negative for sputum production, shortness of breath, wheezing and stridor.   Cardiovascular: Negative for chest pain, palpitations, orthopnea and PND.  Gastrointestinal: Negative for nausea, vomiting, abdominal pain and diarrhea.  Genitourinary: Negative for urgency and frequency.  Musculoskeletal: Negative for back pain and joint pain.  Neurological: Negative for sensory change, speech change, focal weakness and weakness.  Psychiatric/Behavioral: Negative for depression. The patient is not nervous/anxious.   All other systems reviewed and are negative.  Tolerating Diet:yes Tolerating PT: bed bound DRUG ALLERGIES:  No Known Allergies VITALS:  Blood pressure 127/74, pulse 89, temperature 98 F (36.7 C), temperature source Oral, resp. rate 16, height 5\' 9"  (1.753 m), weight 76.204 kg (168 lb), SpO2 95 %. PHYSICAL EXAMINATION:   Physical Exam  GENERAL:  74 y.o.-year-old patient lying in the bed with no acute distress.  EYES: Pupils equal, round, reactive to light and accommodation. No scleral icterus. Extraocular muscles intact.  HEENT: Head atraumatic, normocephalic. Oropharynx and nasopharynx clear.  NECK:  Supple, no jugular venous distention. No thyroid enlargement, no tenderness.  LUNGS: Normal breath sounds bilaterally, no wheezing, rales, rhonchi. No use of accessory muscles of respiration.   CARDIOVASCULAR: S1, S2 normal. No murmurs, rubs, or gallops.  ABDOMEN: Soft, nontender, distended. Bowel sounds present. No organomegaly or mass.  EXTREMITIES: No cyanosis, clubbing or edema b/l.   Contractures+ NEUROLOGIC: Cranial nerves II through XII are intact. No focal neuro deficit PSYCHIATRIC: alert and oriented x 3.  SKIN: No obvious rash, lesion, or ulcer.  LABORATORY PANEL:   CBC  Recent Labs Lab 09/28/14 0123  09/28/14 1327  WBC 11.4*  --   --   HGB 13.0  < > 12.1*  HCT 39.9*  < > 37.2*  PLT 229  --   --   < > = values in this interval not displayed.  Chemistries   Recent Labs Lab 09/27/14 1907 09/28/14 0123  NA 139 139  K 3.9 3.9  CL 103 106  CO2 27 25  GLUCOSE 142* 90  BUN 14 15  CREATININE 0.86 0.89  CALCIUM 9.1 8.7*  AST 15  --   ALT 9*  --   ALKPHOS 95  --   BILITOT 0.4  --    ASSESSMENT AND PLAN:   Jevan Gaunt is a 74 y.o. male with a known history of coronary artery disease, COPD, hypertension and depression is presenting to the ED from [facility] for abdominal distention from acute urinary retention for the past 2.5 days. ED staff were unsuccessful to place a Foley catheter urology consult was placed and Foley catheter was placed with urology, who has recommended the patient to follow up with [facility] neurology as an outpatient. Patient was about to be discharged from the ED but he started vomiting blood.  1. Hematemesis Regular diet IV fluid hydration Monitor hemoglobin and hematocrit every 6 hours Provide blood transfusion as needed basis GI  prophylaxis with Protonix IV EGD showed single small angioectasia with no bleeding amd few polyps but no active bleeder, wanting his foley out but seems somewhat confused.  2. Acute urinary retention secondary to bulbar stricture  Foley catheter was placed by urology  recommended the patient to follow up with urology as an outpatient with [facility] urology group  3. Chronic history of  hypertension Can resume home meds   4. Coronary artery disease Currently patient is asymptomatic  5. Chronic history of COPD No acute exacerbation at this time. Continue inhalers and provide nebulizer treatments as needed basis  6.DVT prophylaxis with SCDs  Case discussed with Care Management/Social Worker. Management plans discussed with the patient and in agreement.  CODE STATUS: Full  DVT Prophylaxis:   TOTAL TIME TAKING CARE OF THIS PATIENT: 40 minutes.  >50% time spent on counselling and coordination of care  POSSIBLE D/C IN 1 DAYS, DEPENDING ON CLINICAL CONDITION and diet tolerance   [facility], Stonewall Doss M.D on 09/29/2014 at 5:07 PM  Between 7am to 6pm - Pager - [phone]  After 6pm go to [email or web address] - password [email or web address] [facility]  [place] [facility]  Office  [phone]  CC: Primary care physician; No primary care provider on file.

## 2014-09-29 NOTE — Op Note (Signed)
[facility] Gastroenterology Patient Name: Brent Ryan Procedure Date: 09/29/2014 1:40 PM MRN: [id number] Account #: [id number] Date of Birth: 10/25/1940 Admit Type: Inpatient Age: 74 Room: [facility] ENDO ROOM 1 Gender: Male Note Status: Finalized Procedure:         Upper GI endoscopy Indications:       Hematemesis Providers:         Lucilla Lame, MD Referring MD:      Donald Pore (Referring MD) Medicines:         Propofol per Anesthesia Complications:     No immediate complications. Procedure:         Pre-Anesthesia Assessment:                    - Prior to the procedure, a History and Physical was                     performed, and patient medications and allergies were                     reviewed. The patient's tolerance of previous anesthesia                     was also reviewed. The risks and benefits of the procedure                     and the sedation options and risks were discussed with the                     patient. All questions were answered, and informed consent                     was obtained. Prior Anticoagulants: The patient has taken                     no previous anticoagulant or antiplatelet agents. ASA                     Grade Assessment: II - A patient with mild systemic                     disease. After reviewing the risks and benefits, the                     patient was deemed in satisfactory condition to undergo                     the procedure.                    After obtaining informed consent, the endoscope was passed                     under direct vision. Throughout the procedure, the                     patient's blood pressure, pulse, and oxygen saturations                     were monitored continuously. The Endoscope was introduced                     through the mouth, and advanced to the second part of  duodenum. The upper GI endoscopy was accomplished without                     difficulty. The  patient tolerated the procedure well. Findings:      The examined esophagus was normal.      A single small angioectasia with no bleeding was found in the gastric       body. Coagulation for hemostasis using snare was successful.      A few 6 mm sessile polyps with bleeding and no stigmata of recent       bleeding were found in the gastric body. The polyp was removed with a       hot snare. Resection and retrieval were complete. To prevent bleeding       after the polypectomy, one hemostatic clip was successfully placed.       There was no bleeding at the end of the procedure.      The examined duodenum was normal. Impression:        - Normal esophagus.                    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                     Treated with a hot snare.                    - A few gastric polyps. Resected and retrieved. Clip was                     placed.                    - Normal examined duodenum. Recommendation:    - Await pathology results. 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 All rights reserved. The codes documented in this report are preliminary and upon coder review may  be revised to meet current compliance requirements. Lucilla Lame, MD 09/29/2014 2:12:15 PM This report has been signed electronically. Number of Addenda: 0 Note Initiated On: 09/29/2014  1:40 PM  [facility]

## 2014-09-30 DIAGNOSIS — K317 Polyp of stomach and duodenum: Secondary | ICD-10-CM

## 2014-09-30 DIAGNOSIS — K31819 Angiodysplasia of stomach and duodenum without bleeding: Secondary | ICD-10-CM

## 2014-09-30 MED ORDER — PANTOPRAZOLE SODIUM 40 MG PO TBEC: 40.0000 mg | DELAYED_RELEASE_TABLET | Freq: Two times a day (BID) | ORAL | Status: AC

## 2014-09-30 NOTE — Progress Notes (Signed)
Report called to [facility]. At [facility].

## 2014-09-30 NOTE — Progress Notes (Signed)
Patient is medically stable for D/C to [facility] today. Per Neoma Laming admissions coordinator at [facility] patient is going to room 330-A. RN will call report to [facility] and arrange EMS for transport. Clinical Education officer, museum (CSW) prepared D/C packet and sent D/C Summary and follow up appointments to [employer] via carefinder. Patient is aware of above. Per patient he has not friends or family to notify of D/C. Please reconsult if future social work needs arise. CSW signing off.   Blima Rich, [place] [phone]

## 2014-09-30 NOTE — Discharge Summary (Signed)
Brent Ryan at [place] NAME: Brent Ryan    MR#:  [id number]  DATE OF BIRTH:  [birth year]  DATE OF ADMISSION:  09/27/2014 ADMITTING PHYSICIAN: Nicholes Mango, MD  DATE OF DISCHARGE: 09/30/2014  PRIMARY CARE PHYSICIAN: No primary care provider on file.    ADMISSION DIAGNOSIS:  Urinary retention [R33.9] Hematemesis with nausea [K92.0, R11.0] DISCHARGE DIAGNOSIS:  Principal Problem:   Hematemesis Active Problems:   Acute urinary retention   Urethral stricture  SECONDARY DIAGNOSIS:   Past Medical History  Diagnosis Date  . COPD (chronic obstructive pulmonary disease)   . Chronic pain   . Contracture of left knee   . Hypertension   . Dysphagia   . Coronary artery disease   . Depressed   . S/P AKA (above knee amputation)    HOSPITAL COURSE:  Brent Ryan is a 74 y.o. male with a known history of coronary artery disease, COPD, hypertension and depression is presenting to the ED from [facility] for abdominal distention from acute urinary retention for the several days. ED staff were unsuccessful to place a Foley catheter so urology consult was placed and Foley catheter was placed by urology, they recommended the patient to follow up with [facility] neurology as an outpatient. Patient was about to be discharged from the ED but he started vomiting blood so was admitted. Please see Dr Rinaldo Ratel dictated [place] for further details. GI consultation was obtained with Dr. Lucilla Lame who recommended EGD which was performed with results as below, no active bleeder.  Patient hemodynamically was stable and was discharged back to his facility in stable condition.  1. Hematemesis: EGD showed single small angioectasia with no bleeding amd few polyps but no active bleeder. 2. Acute urinary retention secondary to bulbar stricture: Foley catheter was placed by urology and recommended the patient to follow up as an outpatient with [facility] urology  group  DISCHARGE CONDITIONS:  Stable CONSULTS OBTAINED:  Treatment Team:  Irine Seal, MD Nicholes Mango, MD Lucilla Lame, MD Max Sane, MD DRUG ALLERGIES:  No Known Allergies DISCHARGE MEDICATIONS:   Current Discharge Medication List    START taking these medications   Details  pantoprazole (PROTONIX) 40 MG tablet Take 1 tablet (40 mg total) by mouth 2 (two) times daily. Qty: 30 tablet, Refills: 0      CONTINUE these medications which have NOT CHANGED   Details  acetaminophen (TYLENOL) 500 MG tablet Take 1,000 mg by mouth 2 (two) times daily.    albuterol (PROVENTIL HFA;VENTOLIN HFA) 108 (90 BASE) MCG/ACT inhaler Inhale 2 puffs into the lungs 4 (four) times daily.    aspirin EC 81 MG tablet Take 81 mg by mouth daily.    doxazosin (CARDURA) 1 MG tablet Take 1 mg by mouth daily.    escitalopram (LEXAPRO) 10 MG tablet Take 10 mg by mouth daily.    ferrous sulfate 220 (44 FE) MG/5ML solution Take 330 mg by mouth daily.    fluticasone (FLOVENT HFA) 44 MCG/ACT inhaler Inhale 1 puff into the lungs 2 (two) times daily.    Hypromellose (ARTIFICIAL TEARS OP) Apply 1 drop to eye 3 (three) times daily. Pt uses at 6am, 2pm, and 10pm.    ipratropium-albuterol (DUONEB) 0.5-2.5 (3) MG/3ML SOLN Take 3 mLs by nebulization every 6 (six) hours as needed (for wheezing/shortness of breath).    methimazole (TAPAZOLE) 10 MG tablet Take 10 mg by mouth daily.    polyethylene glycol (MIRALAX / GLYCOLAX)  packet Take 17 g by mouth 2 (two) times daily.    sennosides-docusate sodium (SENOKOT-S) 8.6-50 MG tablet Take 1 tablet by mouth at bedtime.    sucralfate (CARAFATE) 1 GM/10ML suspension Take 1 g by mouth 3 (three) times daily.    tamsulosin (FLOMAX) 0.4 MG CAPS capsule Take 0.4 mg by mouth daily.    tiotropium (SPIRIVA) 18 MCG inhalation capsule Place 18 mcg into inhaler and inhale daily.    traMADol (ULTRAM) 50 MG tablet Take 50 mg by mouth 2 (two) times daily.    Vitamin D,  Ergocalciferol, (DRISDOL) 50000 UNITS CAPS capsule Take 50,000 Units by mouth every 30 (thirty) days.      STOP taking these medications     ranitidine (ZANTAC) 150 MG tablet        DISCHARGE INSTRUCTIONS:    DIET:  Regular diet  DISCHARGE CONDITION:  Stable  ACTIVITY:  Activity as tolerated  OXYGEN:  Home Oxygen: No.   Oxygen Delivery: room air  DISCHARGE LOCATION:  nursing home   If you experience worsening of your admission symptoms, develop shortness of breath, life threatening emergency, suicidal or homicidal thoughts you must seek medical attention immediately by calling 911 or calling your MD immediately  if symptoms less severe.  You Must read complete instructions/literature along with all the possible adverse reactions/side effects for all the Medicines you take and that have been prescribed to you. Take any new Medicines after you have completely understood and accpet all the possible adverse reactions/side effects.   Please note  You were cared for by a hospitalist during your hospital stay. If you have any questions about your discharge medications or the care you received while you were in the hospital after you are discharged, you can call the unit and asked to speak with the hospitalist on call if the hospitalist that took care of you is not available. Once you are discharged, your primary care physician will handle any further medical issues. Please note that NO REFILLS for any discharge medications will be authorized once you are discharged, as it is imperative that you return to your primary care physician (or establish a relationship with a primary care physician if you do not have one) for your aftercare needs so that they can reassess your need for medications and monitor your lab values.   On the day of Discharge:  VITAL SIGNS:  Blood pressure 148/65, pulse 99, temperature 98.2 F (36.8 C), temperature source Oral, resp. rate 18, height 5\' 9"  (1.753  m), weight 76.204 kg (168 lb), SpO2 95 %. PHYSICAL EXAMINATION:  GENERAL:  74 y.o.-year-old patient lying in the bed with no acute distress.  EYES: Pupils equal, round, reactive to light and accommodation. No scleral icterus. Extraocular muscles intact.  HEENT: Head atraumatic, normocephalic. Oropharynx and nasopharynx clear.  NECK:  Supple, no jugular venous distention. No thyroid enlargement, no tenderness.  LUNGS: Normal breath sounds bilaterally, no wheezing, rales,rhonchi or crepitation. No use of accessory muscles of respiration.  CARDIOVASCULAR: S1, S2 normal. No murmurs, rubs, or gallops.  ABDOMEN: Soft, non-tender, non-distended. Bowel sounds present. No organomegaly or mass.  EXTREMITIES: No pedal edema, cyanosis, or clubbing.  NEUROLOGIC: Cranial nerves II through XII are intact. Muscle strength 5/5 in all extremities. Sensation intact. Gait not checked.  PSYCHIATRIC: The patient is alert and oriented x 3.  SKIN: No obvious rash, lesion, or ulcer.  DATA REVIEW:   CBC  Recent Labs Lab 09/28/14 0123  09/28/14 1327  WBC 11.4*  --   --  HGB 13.0  < > 12.1*  HCT 39.9*  < > 37.2*  PLT 229  --   --   < > = values in this interval not displayed.  Chemistries   Recent Labs Lab 09/27/14 1907 09/28/14 0123  NA 139 139  K 3.9 3.9  CL 103 106  CO2 27 25  GLUCOSE 142* 90  BUN 14 15  CREATININE 0.86 0.89  CALCIUM 9.1 8.7*  AST 15  --   ALT 9*  --   ALKPHOS 95  --   BILITOT 0.4  --    Management plans discussed with the patient, family and they are in agreement.  CODE STATUS: Full code  TOTAL TIME TAKING CARE OF THIS PATIENT: 55 minutes.    [facility], Carlesha Seiple M.D on 09/30/2014 at 10:33 AM  Between 7am to 6pm - Pager - [phone]  After 6pm go to [email or web address] - password [email or web address] [facility]  [place] [facility]  Office  [phone]  CC: Primary care physician; physician at [facility] urology  Lucilla Lame, MD

## 2014-09-30 NOTE — Progress Notes (Signed)
Spoke with Rosendo Gros, [facility] rep at [phone], to notify of non-emergent EMS transport.  Auth notification reference given as [id number].   Service date range good from 09/30/14 - 12/29/14.   Gap exception requested to determine if services can be considered at an in-network level.

## 2014-09-30 NOTE — Care Management Important Message (Signed)
Important Message  Patient Details  Name: Brent Ryan MRN: [id number] Date of Birth: 01-13-1941   Medicare Important Message Given:  Yes-second notification given    Brent Ryan 09/30/2014, 9:37 AM

## 2014-09-30 NOTE — Discharge Instructions (Signed)
Acute Urinary Retention Acute urinary retention is when you are unable to pee (urinate). Acute urinary retention is common in older men. Prostates can get bigger, which blocks the flow of pee.  HOME CARE  Drink enough fluids to keep your pee clear or pale yellow.  If you are sent home with a tube that drains the bladder (catheter), there will be a drainage bag attached to it. There are two types of bags. One is big that you can wear at night without having to empty it. One is smaller and needs to be emptied more often.  Keep the drainage bag empty.  Keep the drainage bag lower than your catheter.  Only take medicine as told by your doctor. GET HELP IF:  You have a low-grade fever.  You have spasms or you are leaking pee when you have spasms. GET HELP RIGHT AWAY IF:   You have chills or a fever.  Your catheter stops draining pee.  Your catheter falls out.  You have increased bleeding that does not stop after you have rested and increased the amount of fluids you had been drinking. MAKE SURE YOU:   Understand these instructions.  Will watch your condition.  Will get help right away if you are not doing well or get worse. Document Released: 08/08/2007 Document Revised: 12/10/2012 Document Reviewed: 07/31/2012 [facility] Patient Information 2015 [place], [place]. This information is not intended to replace advice given to you by your health care provider. Make sure you discuss any questions you have with your health care provider.  Hematemesis This condition is the vomiting of blood. CAUSES  This can happen if you have a peptic ulcer or an irritation of the throat, stomach, or small bowel. Vomiting over and over again or swallowing blood from a nosebleed, coughing or facial injury can also result in bloody vomit. Anti-inflammatory pain medicines are a common cause of this potentially dangerous condition. The most serious causes of vomiting blood include:  Ulcers (a bacteria called  H. pylori is common cause of ulcers).  Clotting problems.  Alcoholism.  Cirrhosis. TREATMENT  Treatment depends on the cause and the severity of the bleeding. Small amounts of blood streaks in the vomit is not the same as vomiting large amounts of bloody or dark, coffee grounds-like material. Weakness, fainting, dehydration, anemia, and continued alcohol or drug use increase the risk. Examination may include blood, vomit, or stool tests. The presence of bloody or dark stool that tests positive for blood (Hemoccult) means the bleeding has been going on for some time. Endoscopy and imaging studies may be done. Emergency treatment may include:  IV medicines or fluids.  Blood transfusions.  Surgery. Hospital care is required for high risk patients or when IV fluids or blood is needed. Upper GI bleeding can cause shock and death if not controlled. HOME CARE INSTRUCTIONS   Your treatment does not require hospital care at this time.  Remain at rest until your condition improves.  Drink clear liquids as tolerated.  Avoid:  Alcohol.  Nicotine.  Aspirin.  Any other anti-inflammatory medicine (ibuprofen, naproxen, and many others).  Medications to suppress stomach acid or vomiting may be needed. Take all your medicine as prescribed.  Be sure to see your caregiver for follow-up as recommended. SEEK IMMEDIATE MEDICAL CARE IF:   You have repeated vomiting, dehydration, fainting, or extreme weakness.  You are vomiting large amounts of bloody or dark material.  You pass large, dark or bloody stools. Document Released: 03/29/2004 Document Revised: 05/14/2011  Document Reviewed: 04/14/2008 [facility] Patient Information 2015 [place], [place]. This information is not intended to replace advice given to you by your health care provider. Make sure you discuss any questions you have with your health care provider.

## 2014-10-01 ENCOUNTER — Encounter: Payer: Self-pay | Admitting: Gastroenterology

## 2014-10-01 LAB — SURGICAL PATHOLOGY

## 2014-10-05 ENCOUNTER — Encounter: Payer: Self-pay | Admitting: Gastroenterology

## 2014-10-11 ENCOUNTER — Encounter: Payer: Self-pay | Admitting: Urology

## 2014-10-11 ENCOUNTER — Ambulatory Visit: Payer: Medicare Other

## 2014-10-21 DIAGNOSIS — R339 Retention of urine, unspecified: Secondary | ICD-10-CM | POA: Insufficient documentation

## 2014-11-04 ENCOUNTER — Ambulatory Visit: Payer: Medicare Other | Admitting: Gastroenterology

## 2014-11-10 ENCOUNTER — Other Ambulatory Visit: Payer: Self-pay

## 2014-11-11 ENCOUNTER — Ambulatory Visit (INDEPENDENT_AMBULATORY_CARE_PROVIDER_SITE_OTHER): Payer: Medicare Other | Admitting: Gastroenterology

## 2014-11-11 ENCOUNTER — Other Ambulatory Visit: Payer: Self-pay

## 2014-11-11 ENCOUNTER — Ambulatory Visit: Payer: Medicare Other | Admitting: Gastroenterology

## 2014-11-11 ENCOUNTER — Encounter: Payer: Self-pay | Admitting: Gastroenterology

## 2014-11-11 VITALS — BP 128/64 | HR 72

## 2014-11-11 DIAGNOSIS — K922 Gastrointestinal hemorrhage, unspecified: Secondary | ICD-10-CM

## 2014-11-11 NOTE — Progress Notes (Signed)
Primary Care Physician: Milinda Cave, DO  Primary Gastroenterologist:  Dr. Lucilla Lame  Chief Complaint  Patient presents with  . FOLLOW-UP ER EGD    HPI: Brent Ryan is a 74 y.o. male here for follow-up after having an upper endoscopy for upper GI bleeding.the patient had a polyp that was removed and a clip placed on the polypectomy site. The polyp was an ulcerated hyperplastic polyp.  Current Outpatient Prescriptions  Medication Sig Dispense Refill  . acetaminophen (TYLENOL) 500 MG tablet Take 1,000 mg by mouth 2 (two) times daily.    Marland Kitchen albuterol (PROVENTIL HFA;VENTOLIN HFA) 108 (90 BASE) MCG/ACT inhaler Inhale 2 puffs into the lungs 4 (four) times daily.    Marland Kitchen amLODipine (NORVASC) 2.5 MG tablet Take 2.5 mg by mouth.    Marland Kitchen aspirin EC 81 MG tablet Take 81 mg by mouth daily.    . divalproex (DEPAKOTE ER) 250 MG 24 hr tablet Take 250 mg by mouth.    . doxazosin (CARDURA) 1 MG tablet Take 1 mg by mouth daily.    Marland Kitchen escitalopram (LEXAPRO) 10 MG tablet Take 10 mg by mouth daily.    . ferrous sulfate 220 (44 FE) MG/5ML solution Take 330 mg by mouth daily.    . fluticasone (FLOVENT HFA) 44 MCG/ACT inhaler Inhale 1 puff into the lungs 2 (two) times daily.    . Hypromellose (ARTIFICIAL TEARS OP) Apply 1 drop to eye 3 (three) times daily. Pt uses at 6am, 2pm, and 10pm.    . ipratropium-albuterol (DUONEB) 0.5-2.5 (3) MG/3ML SOLN Take 3 mLs by nebulization every 6 (six) hours as needed (for wheezing/shortness of breath).    . methimazole (TAPAZOLE) 10 MG tablet Take 10 mg by mouth daily.    . metoprolol tartrate (LOPRESSOR) 25 MG tablet Take 25 mg by mouth.    . nystatin (MYCOSTATIN/NYSTOP) 100000 UNIT/GM POWD     . omeprazole (PRILOSEC) 20 MG capsule Take 20 mg by mouth.    . pantoprazole (PROTONIX) 40 MG tablet Take 1 tablet (40 mg total) by mouth 2 (two) times daily. 30 tablet 0  . polyethylene glycol (MIRALAX / GLYCOLAX) packet Take 17 g by mouth 2 (two) times daily.    .  promethazine (PHENERGAN) 25 MG/ML injection     . sennosides-docusate sodium (SENOKOT-S) 8.6-50 MG tablet Take 1 tablet by mouth at bedtime.    . sertraline (ZOLOFT) 25 MG tablet Take 25 mg by mouth.    . sucralfate (CARAFATE) 1 GM/10ML suspension Take 1 g by mouth 3 (three) times daily.    . tamsulosin (FLOMAX) 0.4 MG CAPS capsule Take 0.4 mg by mouth daily.    Marland Kitchen tiotropium (SPIRIVA) 18 MCG inhalation capsule Place 18 mcg into inhaler and inhale daily.    . traMADol (ULTRAM) 50 MG tablet Take 50 mg by mouth 2 (two) times daily.    . Vitamin D, Ergocalciferol, (DRISDOL) 50000 UNITS CAPS capsule Take 50,000 Units by mouth every 30 (thirty) days.     No current facility-administered medications for this visit.    Allergies as of 11/11/2014  . (No Known Allergies)    ROS:  General: Negative for anorexia, weight loss, fever, chills, fatigue, weakness. ENT: Negative for hoarseness, difficulty swallowing , nasal congestion. CV: Negative for chest pain, angina, palpitations, dyspnea on exertion, peripheral edema.  Respiratory: Negative for dyspnea at rest, dyspnea on exertion, cough, sputum, wheezing.  GI: See history of present illness. GU:  Negative for dysuria, hematuria, urinary incontinence, urinary frequency, nocturnal  urination.  Endo: Negative for unusual weight change.    Physical Examination:   BP 128/64 mmHg  Pulse 72  General: Well-nourished, obese,well-developed in no acute distress.  Eyes: No icterus. Conjunctivae pink. Mouth: Oropharyngeal mucosa moist and pink , no lesions erythema or exudate. Lungs: Clear to auscultation bilaterally. Non-labored. Heart: Regular rate and rhythm, no murmurs rubs or gallops.  Abdomen: Bowel sounds are normal, nontender, nondistended, no hepatosplenomegaly or masses, no abdominal bruits or hernia , no rebound or guarding.   Neuro: Alert and oriented x 3.  Grossly intact. Skin: Warm and dry, no jaundice.   Psych: Alert and cooperative,  normal mood and affect.  Labs:    Imaging Studies: No results found.  Assessment and Plan:   Brent Ryan is a 74 y.o. y/o male who is here for follow-up after having an upper endoscopy at the hospital. The patient has had no report of any further bleeding or signs of bleeding. The patient has also not had any abdominal pain. The patient is more concerned about getting his Foley catheter out that he is about his abdominal problems at the present time. The polyp removed from his stomach was a hyperplastic polyp with ulcerations. He should should follow-up with me if he has any further problems.   Note: This dictation was prepared with Dragon dictation along with smaller phrase technology. Any transcriptional errors that result from this process are unintentional.

## 2014-12-16 ENCOUNTER — Ambulatory Visit: Payer: Medicare Other | Admitting: Gastroenterology

## 2014-12-29 ENCOUNTER — Emergency Department: Payer: Medicare Other

## 2014-12-29 ENCOUNTER — Emergency Department
Admission: EM | Admit: 2014-12-29 | Discharge: 2014-12-29 | Disposition: A | Discharge status: Home / Self Care | Payer: Medicare Other | Attending: Student | Admitting: Student

## 2014-12-29 ENCOUNTER — Encounter: Payer: Self-pay | Admitting: Emergency Medicine

## 2014-12-29 DIAGNOSIS — R51 Headache: Secondary | ICD-10-CM | POA: Insufficient documentation

## 2014-12-29 DIAGNOSIS — Z87891 Personal history of nicotine dependence: Secondary | ICD-10-CM | POA: Diagnosis not present

## 2014-12-29 DIAGNOSIS — I10 Essential (primary) hypertension: Secondary | ICD-10-CM

## 2014-12-29 DIAGNOSIS — R519 Headache, unspecified: Secondary | ICD-10-CM

## 2014-12-29 DIAGNOSIS — Z7951 Long term (current) use of inhaled steroids: Secondary | ICD-10-CM | POA: Diagnosis not present

## 2014-12-29 DIAGNOSIS — Z79899 Other long term (current) drug therapy: Secondary | ICD-10-CM | POA: Diagnosis not present

## 2014-12-29 LAB — CBC
HCT: 43.7 % (ref 40.0–52.0)
Hemoglobin: 14.3 g/dL (ref 13.0–18.0)
MCH: 28.4 pg (ref 26.0–34.0)
MCHC: 32.7 g/dL (ref 32.0–36.0)
MCV: 86.6 fL (ref 80.0–100.0)
PLATELETS: 204 10*3/uL (ref 150–440)
RBC: 5.04 MIL/uL (ref 4.40–5.90)
RDW: 14.7 % — ABNORMAL HIGH (ref 11.5–14.5)
WBC: 12.8 10*3/uL — ABNORMAL HIGH (ref 3.8–10.6)

## 2014-12-29 LAB — COMPREHENSIVE METABOLIC PANEL
ALK PHOS: 121 U/L (ref 38–126)
ALT: 9 U/L — ABNORMAL LOW (ref 17–63)
ANION GAP: 9 (ref 5–15)
AST: 14 U/L — ABNORMAL LOW (ref 15–41)
Albumin: 3.5 g/dL (ref 3.5–5.0)
BUN: 17 mg/dL (ref 6–20)
CALCIUM: 8.9 mg/dL (ref 8.9–10.3)
CHLORIDE: 99 mmol/L — AB (ref 101–111)
CO2: 25 mmol/L (ref 22–32)
Creatinine, Ser: 0.92 mg/dL (ref 0.61–1.24)
GFR calc non Af Amer: >60 mL/min (ref >60)
Glucose, Bld: 158 mg/dL — ABNORMAL HIGH (ref 65–99)
Potassium: 4.3 mmol/L (ref 3.5–5.1)
SODIUM: 133 mmol/L — AB (ref 135–145)
Total Bilirubin: 0.9 mg/dL (ref 0.3–1.2)
Total Protein: 7.7 g/dL (ref 6.5–8.1)

## 2014-12-29 LAB — TROPONIN I: Troponin I: <0.03 ng/mL (ref <0.031)

## 2014-12-29 MED ORDER — IOHEXOL 350 MG/ML SOLN
80.0000 mL | Freq: Once | INTRAVENOUS | Status: AC | PRN
  Administered 2014-12-29: 80 mL via INTRAVENOUS

## 2014-12-29 MED ORDER — METOCLOPRAMIDE HCL 5 MG/ML IJ SOLN
10.0000 mg | Freq: Once | INTRAMUSCULAR | Status: AC
  Administered 2014-12-29: 10 mg via INTRAVENOUS
  Filled 2014-12-29: qty 2

## 2014-12-29 MED ORDER — BUTALBITAL-APAP-CAFFEINE 50-325-40 MG PO TABS
2.0000 | ORAL_TABLET | Freq: Once | ORAL | Status: AC
  Administered 2014-12-29: 2 via ORAL

## 2014-12-29 MED ORDER — METOPROLOL TARTRATE 25 MG PO TABS
25.0000 mg | ORAL_TABLET | Freq: Once | ORAL | Status: AC
  Administered 2014-12-29: 25 mg via ORAL
  Filled 2014-12-29: qty 1

## 2014-12-29 MED ORDER — DIPHENHYDRAMINE HCL 50 MG/ML IJ SOLN
25.0000 mg | Freq: Once | INTRAMUSCULAR | Status: AC
  Administered 2014-12-29: 25 mg via INTRAVENOUS
  Filled 2014-12-29: qty 1

## 2014-12-29 MED ORDER — ASPIRIN EC 325 MG PO TBEC: 325.0000 mg | DELAYED_RELEASE_TABLET | Freq: Every day | ORAL | Status: AC

## 2014-12-29 MED ORDER — BUTALBITAL-APAP-CAFFEINE 50-325-40 MG PO TABS
ORAL_TABLET | ORAL | Status: AC
  Filled 2014-12-29: qty 2

## 2014-12-29 NOTE — ED Notes (Signed)
Report given to Jill, RN

## 2014-12-29 NOTE — ED Notes (Signed)
Radiology reported to RN IV infiltrated while pushing contrast, IV removed by Radiology, left arm bruised and swollen, warm compress applied, will continue to monitor

## 2014-12-29 NOTE — ED Notes (Signed)
Pt refused IV for CTA, reported to Dr.Gayle, pt is alert and oriented to self, per Dr.Gayle pt is unable to make medical decisions ,IV must be started, IV inserted by RN

## 2014-12-29 NOTE — ED Provider Notes (Signed)
[facility] Emergency Department Provider Note  ____________________________________________  Time seen: Approximately 161 AM  I have reviewed the triage vital signs and the nursing notes.   HISTORY  Chief Complaint Hypertension    HPI Issachar Broady is a 74 y.o. male who comes in from the nursing facility with high blood pressure. The patient has been complaining of a headache since about midnight. According to the patient he has had a headache all day. Initially he reports that the doctor just randomly took his blood pressure and noticed that it was high but when asked if he had a headache the patient reports yes. He reports that his pain is in his right temple and behind both eyeballs. The patient reports that the headache started yesterday and he had pain all day. The patient reports that he did not take anything for pain. When asked if he took anything for his BLOOD pressure the patient also reports no. The patient denies any chest pain or shortness of breath. He reports his headache is a 10 out of 10 in intensity. He's had no abdominal pain no nausea no vomiting or fevers. The patient was sent in by the nursing home but was given a dose of clonidine 0.1 mg orally 1 before coming in.   Past Medical History  Diagnosis Date  . COPD (chronic obstructive pulmonary disease) ([place])   . Chronic pain   . Contracture of left knee   . Hypertension   . Dysphagia   . Coronary artery disease   . Depressed   . S/P AKA (above knee amputation) [facility])     Patient Active Problem List   Diagnosis Date Noted  . Incomplete bladder emptying 10/21/2014  . Angiodysplasia of stomach   . Gastric polyp   . Hematemesis 09/28/2014  . Acute urinary retention 09/27/2014  . Urethral stricture 09/27/2014  . Cancer of kidney ([place]) 05/15/2012  . Calculus of kidney 04/18/2012  . Benign prostatic hyperplasia with urinary obstruction 04/18/2012  . Acquired cyst of kidney 04/18/2012     Past Surgical History  Procedure Laterality Date  . Esophagogastroduodenoscopy N/A 09/29/2014    Procedure: ESOPHAGOGASTRODUODENOSCOPY (EGD);  Surgeon: Lucilla Lame, MD;  Location: [facility] ENDOSCOPY;  Service: Endoscopy;  Laterality: N/A;  . Amputation    . Renal biopsy, percutaneous      Current Outpatient Rx  Name  Route  Sig  Dispense  Refill  . acetaminophen (TYLENOL) 325 MG tablet   Oral   Take 650 mg by mouth every 6 (six) hours as needed for mild pain or fever.         Marland Kitchen acetaminophen (TYLENOL) 500 MG tablet   Oral   Take 1,000 mg by mouth 2 (two) times daily.         Marland Kitchen albuterol (PROVENTIL HFA;VENTOLIN HFA) 108 (90 BASE) MCG/ACT inhaler   Inhalation   Inhale 2 puffs into the lungs 4 (four) times daily.         Marland Kitchen aspirin EC 81 MG tablet   Oral   Take 81 mg by mouth daily.         . cloNIDine (CATAPRES) 0.1 MG tablet   Oral   Take 0.1 mg by mouth once.         . escitalopram (LEXAPRO) 10 MG tablet   Oral   Take 10 mg by mouth daily.         . fluticasone (FLOVENT HFA) 44 MCG/ACT inhaler   Inhalation   Inhale 1 puff  into the lungs 2 (two) times daily.         . Hypromellose (ARTIFICIAL TEARS OP)   Ophthalmic   Apply 1 drop to eye 3 (three) times daily. Pt uses at 6am, 2pm, and 10pm.         . ipratropium-albuterol (DUONEB) 0.5-2.5 (3) MG/3ML SOLN   Nebulization   Take 3 mLs by nebulization every 6 (six) hours as needed (for wheezing/shortness of breath).         . methimazole (TAPAZOLE) 5 MG tablet   Oral   Take 7.5 mg by mouth daily.         . metoprolol tartrate (LOPRESSOR) 25 MG tablet   Oral   Take 25 mg by mouth 2 (two) times daily.          . ondansetron (ZOFRAN) 4 MG tablet   Oral   Take 4 mg by mouth every 6 (six) hours as needed for nausea or vomiting.         . pantoprazole (PROTONIX) 40 MG tablet   Oral   Take 1 tablet (40 mg total) by mouth 2 (two) times daily. Patient taking differently: Take 40 mg by mouth daily.     30 tablet   0   . polyethylene glycol (MIRALAX / GLYCOLAX) packet   Oral   Take 17 g by mouth 2 (two) times daily.         . tamsulosin (FLOMAX) 0.4 MG CAPS capsule   Oral   Take 0.4 mg by mouth daily.         Marland Kitchen tiotropium (SPIRIVA) 18 MCG inhalation capsule   Inhalation   Place 18 mcg into inhaler and inhale daily.         . traMADol (ULTRAM) 50 MG tablet   Oral   Take 50 mg by mouth 2 (two) times daily.         . Vitamin D, Ergocalciferol, (DRISDOL) 50000 UNITS CAPS capsule   Oral   Take 50,000 Units by mouth every 30 (thirty) days.           Allergies Review of patient's allergies indicates no known allergies.  No family history on file.  Social History Social History  Substance Use Topics  . Smoking status: Former Research scientist (life sciences)  . Smokeless tobacco: Never Used  . Alcohol Use: No    Review of Systems Constitutional: High blood pressure No fever/chills Eyes: No visual changes. ENT: No sore throat. Cardiovascular: Denies chest pain. Respiratory: Denies shortness of breath. Gastrointestinal: No abdominal pain.  No nausea, no vomiting.  No diarrhea.  No constipation. Genitourinary: Negative for dysuria. Musculoskeletal: Negative for back pain. Skin: Negative for rash. Neurological: Headache  10-point ROS otherwise negative.  ____________________________________________   PHYSICAL EXAM:  VITAL SIGNS: ED Triage Vitals  Enc Vitals Group     BP 12/29/14 0516 221/121 mmHg     Pulse Rate 12/29/14 0516 60     Resp 12/29/14 0516 18     Temp 12/29/14 0516 97.6 F (36.4 C)     Temp Source 12/29/14 0516 Oral     SpO2 12/29/14 0516 97 %     Weight 12/29/14 0516 160 lb (72.576 kg)     Height --      Head Cir --      Peak Flow --      Pain Score 12/29/14 0517 8     Pain Loc --      Pain Edu? --      Excl.  in [place]? --     Constitutional: Alert and oriented. Well appearing and in mild distress. Eyes: Conjunctivae are normal. PERRL. EOMI. Head:  Atraumatic. Nose: No congestion/rhinnorhea. Mouth/Throat: Mucous membranes are moist.  Oropharynx non-erythematous. Cardiovascular: Normal rate, regular rhythm. Grossly normal heart sounds.  Good peripheral circulation. Respiratory: Normal respiratory effort.  No retractions. Lungs CTAB. Gastrointestinal: Soft and nontender. No distention. Positive bowel sounds Musculoskeletal: No lower extremity tenderness nor edema.   Neurologic:  Normal speech and language. No gross focal neurologic deficits are appreciated.  Skin:  Skin is warm, dry and intact.  Psychiatric: Mood and affect are normal.   ____________________________________________   LABS (all labs ordered are listed, but only abnormal results are displayed)  Labs Reviewed  CBC - Abnormal; Notable for the following:    WBC 12.8 (*)    RDW 14.7 (*)    All other components within normal limits  COMPREHENSIVE METABOLIC PANEL - Abnormal; Notable for the following:    Sodium 133 (*)    Chloride 99 (*)    Glucose, Bld 158 (*)    AST 14 (*)    ALT 9 (*)    All other components within normal limits  TROPONIN I   ____________________________________________  EKG  ED ECG REPORT I, Loney Hering, the attending physician, personally viewed and interpreted this ECG.   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 ____________________________________________   PROCEDURES  Procedure(s) performed: None  Critical Care performed: No  ____________________________________________   INITIAL IMPRESSION / ASSESSMENT AND PLAN / ED COURSE  Pertinent labs &  imaging results that were available during my care of the patient were reviewed by me and considered in my medical decision making (see chart for details).  This is a 74 year old male who comes in today with some elevated blood pressure and headache. The patient reports that this pain is a 10 out of 10 in intensity. He has not taken anything for the headache but did take something for his blood pressure which was elevated. I will give the patient a dose of Reglan and Benadryl to treat his headache and reassess his blood pressure. The patient's blood pressure is starting to improve after the dose of clonidine from his nursing home.  The patient received a dose of Fioricet as well as metoprolol. His blood pressure is improved and he is still complaining of headache. He reports that he does not want a lumbar puncture I will do an MRA to evaluate his brain for infarcts versus aneurysm.  Patient's care will be signed out to Dr. Edd Fabian who will follow-up the results of the MRI/MRA ____________________________________________   FINAL CLINICAL IMPRESSION(S) / ED DIAGNOSES  Final diagnoses:  Acute nonintractable headache, unspecified headache type  Essential hypertension      Loney Hering, MD 12/29/14 [phone]

## 2014-12-29 NOTE — ED Notes (Signed)
Patient transported to CT via stretcher.

## 2014-12-29 NOTE — ED Notes (Signed)
Dr. Dahlia Client at bedside for eval.

## 2014-12-29 NOTE — ED Notes (Signed)
Patient transported to CT 

## 2014-12-29 NOTE — ED Notes (Signed)
Pt incontinent of urine and stool. Pt cleaned and clean, dry incontinence pad applied.

## 2014-12-29 NOTE — ED Notes (Signed)
Pt arrives via EMS from [facility]. Pt c/o headache. Per staff, pt with high bp (230s). Staff gave 0.1 clonidine medication. Per EMS, pt's bp 204/102 en route. Pt AAOx4. NAD noted. RR even and nonlabored.

## 2014-12-29 NOTE — ED Notes (Addendum)
Patient was transported to MRI by paramedic Marcello Moores.

## 2014-12-29 NOTE — ED Notes (Addendum)
Pt unable to sign discharge instructions/e-signature. RN given report to [facility]. Pt going back to nursing home via [facility]

## 2014-12-29 NOTE — ED Provider Notes (Addendum)
-----------------------------------------   12:50 PM on 12/29/2014 -----------------------------------------  Care was assumed by Dr. Dahlia Client at approximately 8 AM pending results of MRI/MRA brain which ultimately were not obtained as the patient could not be cleared for that study given his chronic cognitive impairments. CTA head was obtained which shows no acute arterial finding or explanation for the patient's headache. 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 His blood pressure has improved significantly. He is resting comfortably. There is no evidence of subarachnoid hemorrhage or aneurysm. I discussed this with Chauncey Reading of neurology who agrees that the patient likely would not benefit from admission however he recommends increasing  aspirin from 81 mg daily to 325 mg daily. I also discussed the case with the patient's primary care doctor, Dr.Fischer who is in agreement with the plan and will arrange expedient follow-up. I discussed his care with the staff at [facility] who report that he is currently at his baseline mental status and his neurological exam is also baseline. We'll discharge with close PCP follow-up.  Joanne Gavel, MD 12/29/14 [place] Aidaly Cordner, MD 12/29/14 [phone]

## 2014-12-29 NOTE — ED Notes (Addendum)
Patient transported to MRI 

## 2015-01-22 IMAGING — CT CT GUIDED CRYOABLATION
2 of 4 series · 10 of 16 positions shown, 12 images · non-contrast
Comparison: none

[Series 2: soft tissue · axial · 0.95mm/px · z∈[-810,-639]mm · 6 of 81 slices shown, 8 images (1 of 2)]
[im 12/81  soft-tissue]
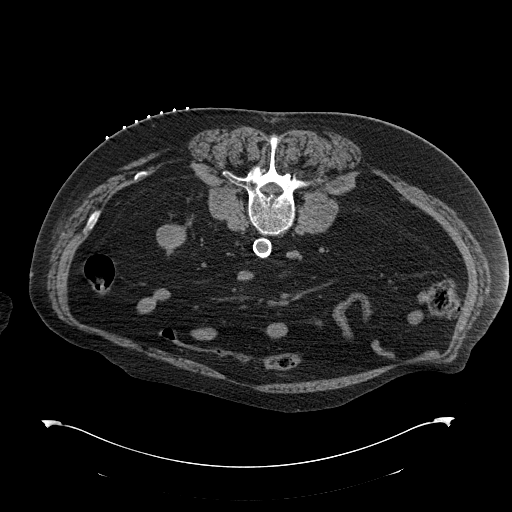
[im 12/81  bone]
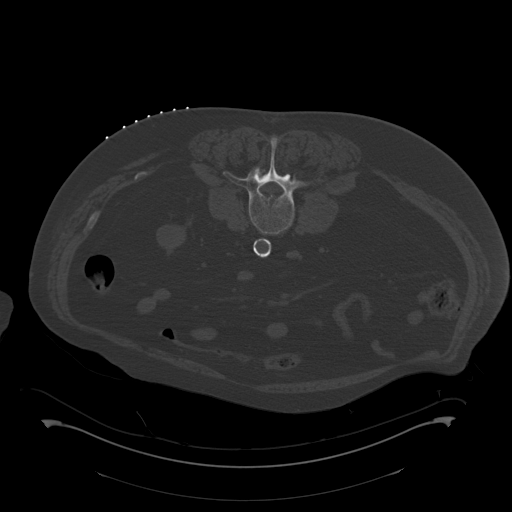
[im 23/81  soft-tissue]
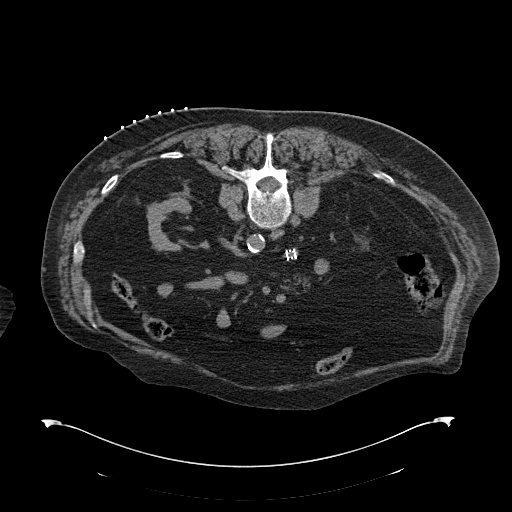
[im 35/81  soft-tissue]
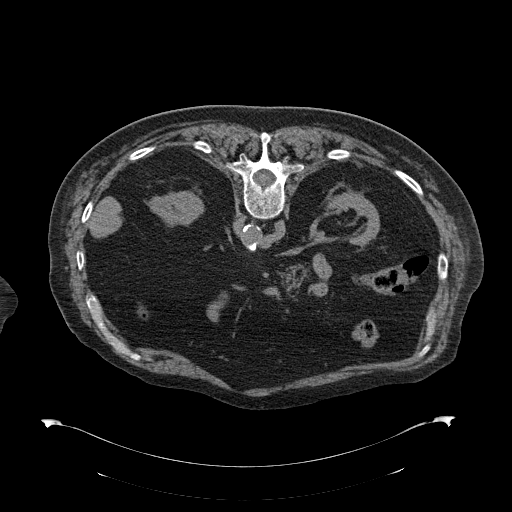
[im 46/81  soft-tissue]
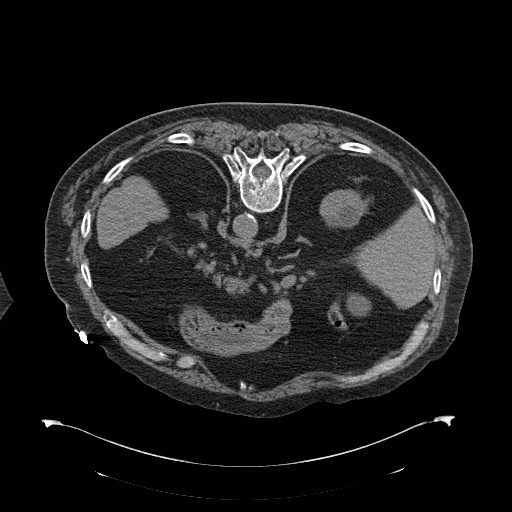
[im 58/81  soft-tissue]
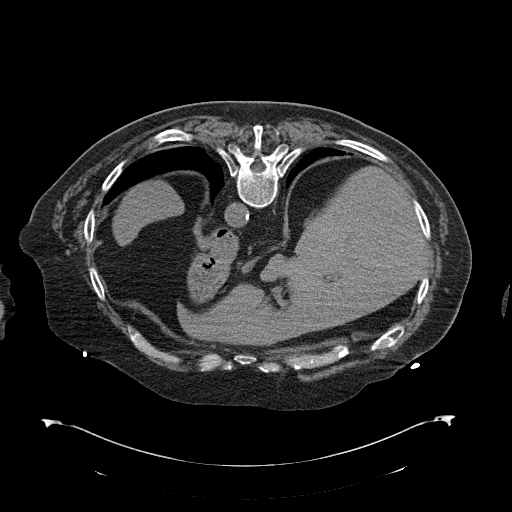
[im 58/81  bone]
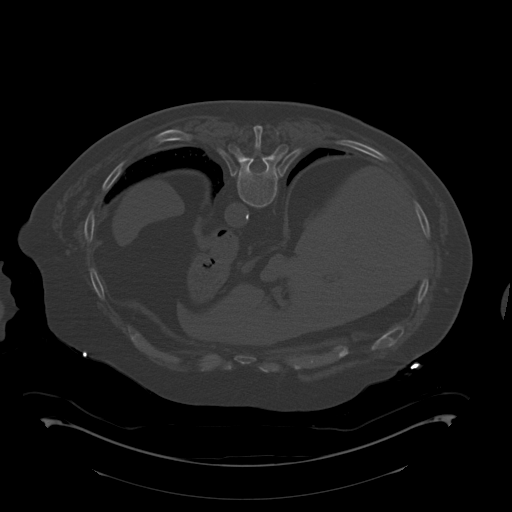
[im 69/81  soft-tissue]
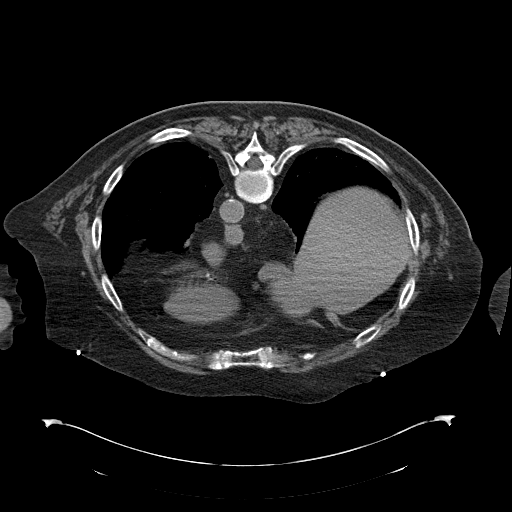

[Series 5: soft tissue · axial · 0.95mm/px · z∈[-806,-692]mm · 4 of 64 slices shown (2 of 2)]
[im 13/64  soft-tissue]
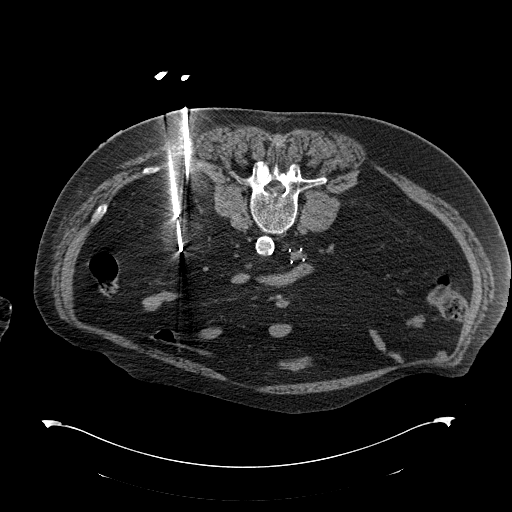
[im 26/64  soft-tissue]
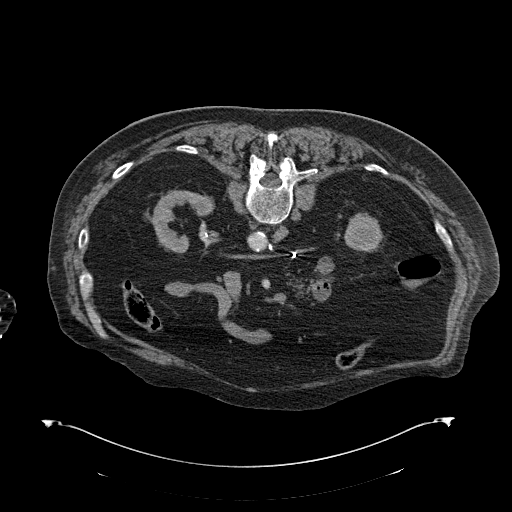
[im 38/64  soft-tissue]
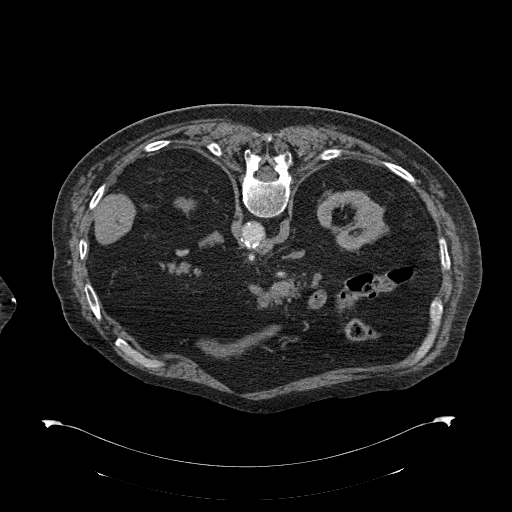
[im 51/64  soft-tissue]
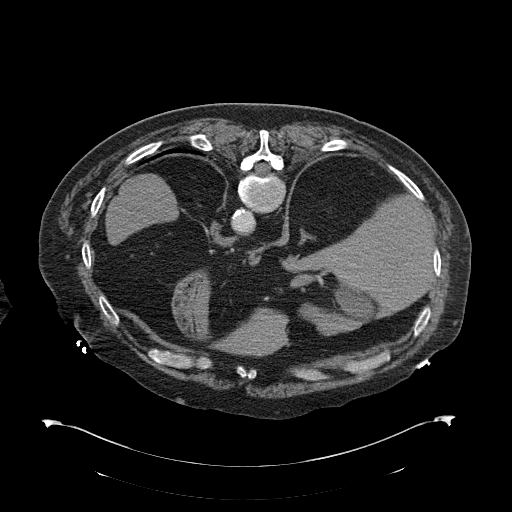

[10 of 16 positions shown; findings below may reference images not displayed]

IMAGES IMPORTED FROM THE [employer] WORKFLOW SYSTEM
NO DICTATION FOR STUDY

## 2015-05-03 ENCOUNTER — Other Ambulatory Visit
Admission: RE | Admit: 2015-05-03 | Discharge: 2015-05-03 | Disposition: A | Discharge status: Home / Self Care | Payer: Medicare Other | Source: Ambulatory Visit | Attending: Nurse Practitioner | Admitting: Nurse Practitioner

## 2015-05-03 DIAGNOSIS — R112 Nausea with vomiting, unspecified: Secondary | ICD-10-CM | POA: Insufficient documentation

## 2015-05-03 LAB — BASIC METABOLIC PANEL
ANION GAP: 9 (ref 5–15)
BUN: 16 mg/dL (ref 6–20)
CALCIUM: 8.8 mg/dL — AB (ref 8.9–10.3)
CO2: 24 mmol/L (ref 22–32)
CREATININE: 0.94 mg/dL (ref 0.61–1.24)
Chloride: 103 mmol/L (ref 101–111)
GFR calc Af Amer: >60 mL/min (ref >60)
GFR calc non Af Amer: >60 mL/min (ref >60)
GLUCOSE: 72 mg/dL (ref 65–99)
Potassium: 4.1 mmol/L (ref 3.5–5.1)
Sodium: 136 mmol/L (ref 135–145)

## 2015-05-03 LAB — CBC WITH DIFFERENTIAL/PLATELET
BASOS ABS: 0.1 10*3/uL (ref 0–0.1)
BASOS PCT: 1 %
EOS ABS: 0.2 10*3/uL (ref 0–0.7)
Eosinophils Relative: 2 %
HCT: 38.2 % — ABNORMAL LOW (ref 40.0–52.0)
HEMOGLOBIN: 12.6 g/dL — AB (ref 13.0–18.0)
Lymphocytes Relative: 12 %
Lymphs Abs: 1.1 10*3/uL (ref 1.0–3.6)
MCH: 28.4 pg (ref 26.0–34.0)
MCHC: 33 g/dL (ref 32.0–36.0)
MCV: 86 fL (ref 80.0–100.0)
Monocytes Absolute: 0.7 10*3/uL (ref 0.2–1.0)
Monocytes Relative: 7 %
NEUTROS PCT: 78 %
Neutro Abs: 6.9 10*3/uL — ABNORMAL HIGH (ref 1.4–6.5)
PLATELETS: 261 10*3/uL (ref 150–440)
RBC: 4.44 MIL/uL (ref 4.40–5.90)
RDW: 15.3 % — AB (ref 11.5–14.5)
WBC: 8.9 10*3/uL (ref 3.8–10.6)

## 2015-05-06 ENCOUNTER — Other Ambulatory Visit
Admission: RE | Admit: 2015-05-06 | Discharge: 2015-05-06 | Disposition: A | Discharge status: Home / Self Care | Payer: Medicare Other | Source: Ambulatory Visit | Attending: Family Medicine | Admitting: Family Medicine

## 2015-05-06 DIAGNOSIS — K810 Acute cholecystitis: Secondary | ICD-10-CM | POA: Diagnosis present

## 2015-05-06 DIAGNOSIS — Z466 Encounter for fitting and adjustment of urinary device: Secondary | ICD-10-CM | POA: Diagnosis present

## 2015-05-06 DIAGNOSIS — K529 Noninfective gastroenteritis and colitis, unspecified: Secondary | ICD-10-CM | POA: Insufficient documentation

## 2015-05-06 DIAGNOSIS — R339 Retention of urine, unspecified: Secondary | ICD-10-CM | POA: Insufficient documentation

## 2015-05-06 LAB — CBC WITH DIFFERENTIAL/PLATELET
BASOS ABS: 0.1 10*3/uL (ref 0–0.1)
BASOS PCT: 1 %
EOS ABS: 0.2 10*3/uL (ref 0–0.7)
EOS PCT: 3 %
HCT: 36.3 % — ABNORMAL LOW (ref 40.0–52.0)
Hemoglobin: 12.2 g/dL — ABNORMAL LOW (ref 13.0–18.0)
Lymphocytes Relative: 18 %
Lymphs Abs: 1.2 10*3/uL (ref 1.0–3.6)
MCH: 28.6 pg (ref 26.0–34.0)
MCHC: 33.6 g/dL (ref 32.0–36.0)
MCV: 85 fL (ref 80.0–100.0)
MONO ABS: 0.5 10*3/uL (ref 0.2–1.0)
MONOS PCT: 8 %
Neutro Abs: 4.6 10*3/uL (ref 1.4–6.5)
Neutrophils Relative %: 70 %
PLATELETS: 231 10*3/uL (ref 150–440)
RBC: 4.26 MIL/uL — ABNORMAL LOW (ref 4.40–5.90)
RDW: 15.5 % — AB (ref 11.5–14.5)
WBC: 6.6 10*3/uL (ref 3.8–10.6)

## 2015-05-06 LAB — BASIC METABOLIC PANEL
Anion gap: 7 (ref 5–15)
BUN: 12 mg/dL (ref 6–20)
CALCIUM: 8.6 mg/dL — AB (ref 8.9–10.3)
CO2: 25 mmol/L (ref 22–32)
CREATININE: 0.9 mg/dL (ref 0.61–1.24)
Chloride: 105 mmol/L (ref 101–111)
Glucose, Bld: 88 mg/dL (ref 65–99)
Potassium: 3.6 mmol/L (ref 3.5–5.1)
SODIUM: 137 mmol/L (ref 135–145)

## 2015-11-07 ENCOUNTER — Other Ambulatory Visit
Admission: RE | Admit: 2015-11-07 | Discharge: 2015-11-07 | Disposition: A | Discharge status: Home / Self Care | Payer: Medicare Other | Source: Ambulatory Visit | Attending: Family Medicine | Admitting: Family Medicine

## 2015-11-07 DIAGNOSIS — N189 Chronic kidney disease, unspecified: Secondary | ICD-10-CM | POA: Diagnosis present

## 2015-11-07 LAB — BASIC METABOLIC PANEL
Anion gap: 7 (ref 5–15)
BUN: 48 mg/dL — ABNORMAL HIGH (ref 6–20)
CALCIUM: 8.1 mg/dL — AB (ref 8.9–10.3)
CO2: 25 mmol/L (ref 22–32)
CREATININE: 1.52 mg/dL — AB (ref 0.61–1.24)
Chloride: 102 mmol/L (ref 101–111)
GFR calc Af Amer: 51 mL/min — ABNORMAL LOW (ref >60)
GFR calc non Af Amer: 44 mL/min — ABNORMAL LOW (ref >60)
GLUCOSE: 90 mg/dL (ref 65–99)
Potassium: 4 mmol/L (ref 3.5–5.1)
Sodium: 134 mmol/L — ABNORMAL LOW (ref 135–145)

## 2016-05-29 ENCOUNTER — Emergency Department
Admission: EM | Admit: 2016-05-29 | Discharge: 2016-05-30 | Disposition: A | Discharge status: Home / Self Care | Payer: Medicare Other | Attending: Student in an Organized Health Care Education/Training Program | Admitting: Student in an Organized Health Care Education/Training Program

## 2016-05-29 ENCOUNTER — Encounter: Payer: Self-pay | Admitting: *Deleted

## 2016-05-29 DIAGNOSIS — T83028A Displacement of other indwelling urethral catheter, initial encounter: Secondary | ICD-10-CM | POA: Diagnosis not present

## 2016-05-29 DIAGNOSIS — Z79899 Other long term (current) drug therapy: Secondary | ICD-10-CM | POA: Diagnosis not present

## 2016-05-29 DIAGNOSIS — J449 Chronic obstructive pulmonary disease, unspecified: Secondary | ICD-10-CM | POA: Diagnosis not present

## 2016-05-29 DIAGNOSIS — Z435 Encounter for attention to cystostomy: Secondary | ICD-10-CM | POA: Diagnosis not present

## 2016-05-29 DIAGNOSIS — T83010A Breakdown (mechanical) of cystostomy catheter, initial encounter: Secondary | ICD-10-CM

## 2016-05-29 DIAGNOSIS — Z85528 Personal history of other malignant neoplasm of kidney: Secondary | ICD-10-CM | POA: Insufficient documentation

## 2016-05-29 DIAGNOSIS — I10 Essential (primary) hypertension: Secondary | ICD-10-CM | POA: Diagnosis not present

## 2016-05-29 DIAGNOSIS — R338 Other retention of urine: Secondary | ICD-10-CM | POA: Diagnosis not present

## 2016-05-29 DIAGNOSIS — Z87891 Personal history of nicotine dependence: Secondary | ICD-10-CM | POA: Insufficient documentation

## 2016-05-29 DIAGNOSIS — I2510 Atherosclerotic heart disease of native coronary artery without angina pectoris: Secondary | ICD-10-CM | POA: Diagnosis not present

## 2016-05-29 DIAGNOSIS — Y828 Other medical devices associated with adverse incidents: Secondary | ICD-10-CM | POA: Insufficient documentation

## 2016-05-29 LAB — URINALYSIS, COMPLETE (UACMP) WITH MICROSCOPIC
Bilirubin Urine: NEGATIVE
Glucose, UA: NEGATIVE mg/dL
Ketones, ur: NEGATIVE mg/dL
Nitrite: POSITIVE — AB
Protein, ur: 100 mg/dL — AB
Specific Gravity, Urine: 1.018 (ref 1.005–1.030)
pH: 5 (ref 5.0–8.0)

## 2016-05-29 MED ORDER — ACETAMINOPHEN 325 MG PO TABS
650.0000 mg | ORAL_TABLET | Freq: Once | ORAL | Status: AC
  Administered 2016-05-29: 650 mg via ORAL
  Filled 2016-05-29: qty 2

## 2016-05-29 NOTE — ED Triage Notes (Signed)
Pt sent from [facility] after RN reported she was unable to inflate balloon on reinserted suprapubic catheter. Catheter was no longer in place upon pts arrival to ED. Attempted to reinsert and new sterile catheter upon arrival failed. Pt is alert and oriented. Suprapubic site is red and appears irritated upon arrival. No heat or discharge noted.

## 2016-05-29 NOTE — ED Provider Notes (Signed)
[facility] Emergency Department Provider Note    First MD Initiated Contact with Patient 05/29/16 1931     (approximate)  I have reviewed the triage vital signs and the nursing notes.   HISTORY  Chief Complaint Catheter Re-insertion    HPI Brent Ryan is a 76 y.o. male with a suprapubic catheter placed in September of this past year due to chronic urethral stricture presents from nursing facility after having the Foley catheter exchanged and being unable to replace. States this was a routine change. He denies any pain. No fevers.   Past Medical History:  Diagnosis Date  . Chronic pain   . Contracture of left knee   . COPD (chronic obstructive pulmonary disease) ([place])   . Coronary artery disease   . Depressed   . Dysphagia   . Hypertension   . S/P AKA (above knee amputation) ([place])    History reviewed. No pertinent family history. Past Surgical History:  Procedure Laterality Date  . AMPUTATION    . ESOPHAGOGASTRODUODENOSCOPY N/A 09/29/2014   Procedure: ESOPHAGOGASTRODUODENOSCOPY (EGD);  Surgeon: Lucilla Lame, MD;  Location: [facility] ENDOSCOPY;  Service: Endoscopy;  Laterality: N/A;  . RENAL BIOPSY, PERCUTANEOUS     Patient Active Problem List   Diagnosis Date Noted  . Incomplete bladder emptying 10/21/2014  . Angiodysplasia of stomach   . Gastric polyp   . Hematemesis 09/28/2014  . Acute urinary retention 09/27/2014  . Urethral stricture 09/27/2014  . Cancer of kidney ([place]) 05/15/2012  . Calculus of kidney 04/18/2012  . Benign prostatic hyperplasia with urinary obstruction 04/18/2012  . Acquired cyst of kidney 04/18/2012      Prior to Admission medications   Medication Sig Start Date End Date Taking? Authorizing Provider  acetaminophen (TYLENOL) 325 MG tablet Take 650 mg by mouth every 6 (six) hours as needed for mild pain or fever.    Historical Provider, MD  acetaminophen (TYLENOL) 500 MG tablet Take 1,000 mg by mouth 2 (two) times  daily.    Historical Provider, MD  albuterol (PROVENTIL HFA;VENTOLIN HFA) 108 (90 BASE) MCG/ACT inhaler Inhale 2 puffs into the lungs 4 (four) times daily.    Historical Provider, MD  aspirin EC 325 MG tablet Take 1 tablet (325 mg total) by mouth daily. 12/29/14   Joanne Gavel, MD  cloNIDine (CATAPRES) 0.1 MG tablet Take 0.1 mg by mouth once.    Historical Provider, MD  escitalopram (LEXAPRO) 10 MG tablet Take 10 mg by mouth daily.    Historical Provider, MD  fluticasone (FLOVENT HFA) 44 MCG/ACT inhaler Inhale 1 puff into the lungs 2 (two) times daily.    Historical Provider, MD  Hypromellose (ARTIFICIAL TEARS OP) Apply 1 drop to eye 3 (three) times daily. Pt uses at 6am, 2pm, and 10pm.    Historical Provider, MD  ipratropium-albuterol (DUONEB) 0.5-2.5 (3) MG/3ML SOLN Take 3 mLs by nebulization every 6 (six) hours as needed (for wheezing/shortness of breath).    Historical Provider, MD  methimazole (TAPAZOLE) 5 MG tablet Take 7.5 mg by mouth daily.    Historical Provider, MD  metoprolol tartrate (LOPRESSOR) 25 MG tablet Take 25 mg by mouth 2 (two) times daily.  04/18/12   Historical Provider, MD  ondansetron (ZOFRAN) 4 MG tablet Take 4 mg by mouth every 6 (six) hours as needed for nausea or vomiting.    Historical Provider, MD  pantoprazole (PROTONIX) 40 MG tablet Take 1 tablet (40 mg total) by mouth 2 (two) times daily. Patient taking differently:  Take 40 mg by mouth daily.  09/30/14   Max Sane, MD  polyethylene glycol (MIRALAX / GLYCOLAX) packet Take 17 g by mouth 2 (two) times daily.    Historical Provider, MD  tamsulosin (FLOMAX) 0.4 MG CAPS capsule Take 0.4 mg by mouth daily.    Historical Provider, MD  tiotropium (SPIRIVA) 18 MCG inhalation capsule Place 18 mcg into inhaler and inhale daily.    Historical Provider, MD  traMADol (ULTRAM) 50 MG tablet Take 50 mg by mouth 2 (two) times daily.    Historical Provider, MD  Vitamin D, Ergocalciferol, (DRISDOL) 50000 UNITS CAPS capsule Take 50,000  Units by mouth every 30 (thirty) days.    Historical Provider, MD    Allergies Patient has no known allergies.    Social History Social History  Substance Use Topics  . Smoking status: Former Research scientist (life sciences)  . Smokeless tobacco: Never Used  . Alcohol use No    Review of Systems Patient denies headaches, rhinorrhea, blurry vision, numbness, shortness of breath, chest pain, edema, cough, abdominal pain, nausea, vomiting, diarrhea, dysuria, fevers, rashes or hallucinations unless otherwise stated above in HPI. ____________________________________________   PHYSICAL EXAM:  VITAL SIGNS: Vitals:   05/29/16 2044 05/29/16 2316  BP: 139/85 (!) 160/82  Pulse: 63 66  Resp: 16 18  Temp:      Constitutional: Alert and oriented. Chronically ill appearing no acute distress. Eyes: Conjunctivae are normal. PERRL. EOMI. Head: Atraumatic. Nose: No congestion/rhinnorhea. Mouth/Throat: Mucous membranes are moist.  Oropharynx non-erythematous. Neck: No stridor. Painless ROM. No cervical spine tenderness to palpation Hematological/Lymphatic/Immunilogical: No cervical lymphadenopathy. Cardiovascular: Normal rate, regular rhythm. Grossly normal heart sounds.  Good peripheral circulation. Respiratory: Normal respiratory effort.  No retractions. Lungs CTAB. Gastrointestinal: Soft and nontender. No distention. No abdominal bruits. No CVA tenderness. Genitourinary: suprapubic stoma appear c/d/I, no suprapubic ttp,  Scant blood from stoma sight Neurologic:  Normal speech and language. No gross focal neurologic deficits are appreciated.  Skin:  Skin is warm, dry and intact. No rash noted.   ____________________________________________   LABS (all labs ordered are listed, but only abnormal results are displayed)  No results found for this or any previous visit (from the past 24  hour(s)). ____________________________________________ ____________________________________________  [id number]   ____________________________________________   PROCEDURES  Procedure(s) performed:  Procedures    Critical Care performed: no ____________________________________________   INITIAL IMPRESSION / ASSESSMENT AND PLAN / ED COURSE  Pertinent labs & imaging results that were available during my care of the patient were reviewed by me and considered in my medical decision making (see chart for details).  DDX: foley catheter displacement,  Bladder trauma, false tract  Lavonte Palos is a 76 y.o. who presents to the ED with displaced suprapubic catheter. Patient no acute distress. Afebrile and otherwise hemodynamically stable. I personally made multiple attempts with different Foley catheter is to obtain bladder access. Upon each insertion the Foley will advance roughly 2-3 cm and then stop and is unable to be passed. Youre unable to inflate the balloon due to resistance and pain.  I spoke with Dr. Louis Meckel of urology regarding my concerns for possible false passage and need for dilation or insertion of Foley catheter over a wire. He kindly agrees to come evaluate patient at bedside for suprapubic catheter placement.  Clinical Course as of May 30 2334  Tue May 29, 2016  2155 Dr. Louis Meckel at bedside to replace foley.  [PR]  2211 Foley catheter was successfully placed by urology at bedside. Patient remains stable.  We'll send urine for specimen but remains in no acute distress.  Have discussed with the patient and available family all diagnostics and treatments performed thus far and all questions were answered to the best of my ability. The patient demonstrates understanding and agreement with plan.   [PR]    Clinical Course User Index [PR] Merlyn Lot, MD     ____________________________________________   FINAL CLINICAL IMPRESSION(S) / ED DIAGNOSES  Final diagnoses:   Suprapubic catheter dysfunction, initial encounter [facility])      NEW MEDICATIONS STARTED DURING THIS VISIT:  New Prescriptions   No medications on file     Note:  This document was prepared using Dragon voice recognition software and may include unintentional dictation errors.    Merlyn Lot, MD 05/29/16 [phone]

## 2016-05-29 NOTE — Consult Note (Signed)
I have been asked to see the patient by Dr. Merlyn Lot, for evaluation and management of suprapubic tube malfunction.  History of present illness: 76 year old male who presented from the local skilled nursing facility after a failed attempt of changing his suprapubic catheter earlier in the day. The patient has a suprapubic catheter because of his history of urinary retention and the requirement for intermittent catheterization. An SP tube was placed to facilitate the passage of urine. It is being changed on a monthly basis. The patient's primary urologist is Dr. Jacqlyn Larsen in [facility].  The patient states that he has had one other time where he had difficulty exchanging the Foley catheter. Typically, the patient has an 14 French suprapubic tube placed. The catheter was due to be exchanged today and removed atraumatically and the nursing facility. They're unable to obtain access and as such the patient was transferred to the emergency department where I evaluated him. In the ED, several different attempts were made to pass a catheter into the suprapubic region.  The patient has some baseline dementia and a right below-knee amputation. He also has a history of renal cell carcinoma which was cryo-ablated.  Review of systems: A 12 point comprehensive review of systems was obtained and is negative unless otherwise stated in the history of present illness.  Patient Active Problem List   Diagnosis Date Noted  . Incomplete bladder emptying 10/21/2014  . Angiodysplasia of stomach   . Gastric polyp   . Hematemesis 09/28/2014  . Acute urinary retention 09/27/2014  . Urethral stricture 09/27/2014  . Cancer of kidney ([place]) 05/15/2012  . Calculus of kidney 04/18/2012  . Benign prostatic hyperplasia with urinary obstruction 04/18/2012  . Acquired cyst of kidney 04/18/2012    No current facility-administered medications on file prior to encounter.    Current Outpatient Prescriptions on  File Prior to Encounter  Medication Sig Dispense Refill  . acetaminophen (TYLENOL) 325 MG tablet Take 650 mg by mouth every 6 (six) hours as needed for mild pain or fever.    Marland Kitchen acetaminophen (TYLENOL) 500 MG tablet Take 1,000 mg by mouth 2 (two) times daily.    Marland Kitchen albuterol (PROVENTIL HFA;VENTOLIN HFA) 108 (90 BASE) MCG/ACT inhaler Inhale 2 puffs into the lungs 4 (four) times daily.    Marland Kitchen aspirin EC 325 MG tablet Take 1 tablet (325 mg total) by mouth daily. 30 tablet 0  . cloNIDine (CATAPRES) 0.1 MG tablet Take 0.1 mg by mouth once.    . escitalopram (LEXAPRO) 10 MG tablet Take 10 mg by mouth daily.    . fluticasone (FLOVENT HFA) 44 MCG/ACT inhaler Inhale 1 puff into the lungs 2 (two) times daily.    . Hypromellose (ARTIFICIAL TEARS OP) Apply 1 drop to eye 3 (three) times daily. Pt uses at 6am, 2pm, and 10pm.    . ipratropium-albuterol (DUONEB) 0.5-2.5 (3) MG/3ML SOLN Take 3 mLs by nebulization every 6 (six) hours as needed (for wheezing/shortness of breath).    . methimazole (TAPAZOLE) 5 MG tablet Take 7.5 mg by mouth daily.    . metoprolol tartrate (LOPRESSOR) 25 MG tablet Take 25 mg by mouth 2 (two) times daily.     . ondansetron (ZOFRAN) 4 MG tablet Take 4 mg by mouth every 6 (six) hours as needed for nausea or vomiting.    . pantoprazole (PROTONIX) 40 MG tablet Take 1 tablet (40 mg total) by mouth 2 (two) times daily. (Patient taking differently: Take 40 mg by mouth daily. ) 30 tablet  0  . polyethylene glycol (MIRALAX / GLYCOLAX) packet Take 17 g by mouth 2 (two) times daily.    . tamsulosin (FLOMAX) 0.4 MG CAPS capsule Take 0.4 mg by mouth daily.    Marland Kitchen tiotropium (SPIRIVA) 18 MCG inhalation capsule Place 18 mcg into inhaler and inhale daily.    . traMADol (ULTRAM) 50 MG tablet Take 50 mg by mouth 2 (two) times daily.    . Vitamin D, Ergocalciferol, (DRISDOL) 50000 UNITS CAPS capsule Take 50,000 Units by mouth every 30 (thirty) days.      Past Medical History:  Diagnosis Date  . Chronic  pain   . Contracture of left knee   . COPD (chronic obstructive pulmonary disease) ([place])   . Coronary artery disease   . Depressed   . Dysphagia   . Hypertension   . S/P AKA (above knee amputation) ([place])     Past Surgical History:  Procedure Laterality Date  . AMPUTATION    . ESOPHAGOGASTRODUODENOSCOPY N/A 09/29/2014   Procedure: ESOPHAGOGASTRODUODENOSCOPY (EGD);  Surgeon: Lucilla Lame, MD;  Location: [facility] ENDOSCOPY;  Service: Endoscopy;  Laterality: N/A;  . RENAL BIOPSY, PERCUTANEOUS      Social History  Substance Use Topics  . Smoking status: Former Research scientist (life sciences)  . Smokeless tobacco: Never Used  . Alcohol use No    History reviewed. No pertinent family history.  PE: Vitals:   05/29/16 1920 05/29/16 2016 05/29/16 2020 05/29/16 2044  BP: (!) 149/59   139/85  Pulse: 60   63  Resp: 18   16  Temp:   98.2 F (36.8 C)   TempSrc:   Oral   SpO2: 100%   100%  Weight:  72.6 kg (160 lb)    Height:  5\' 9"  (1.753 m)     Patient appears to be in no acute distress  patient is alert and oriented x3 Atraumatic normocephalic head No cervical or supraclavicular lymphadenopathy appreciated No increased work of breathing, no audible wheezes/rhonchi Regular sinus rhythm/rate Abdomen is soft, nontender, nondistended, no CVA The patient is a well epithelialized suprapubic tract with no evidence of infection. The patient's penis is buried. His scrotum is without lesion and is nontender. Lower extremities are symmetric without appreciable edema Grossly neurologically intact No identifiable skin lesions  No results for input(s): WBC, HGB, HCT in the last 72 hours. No results for input(s): NA, K, CL, CO2, GLUCOSE, BUN, CREATININE, CALCIUM in the last 72 hours. No results for input(s): LABPT, INR in the last 72 hours. No results for input(s): LABURIN in the last 72 hours. Results for orders placed or performed during the hospital encounter of 09/27/14  MRSA PCR Screening     Status: None    Collection Time: 09/28/14  1:28 AM  Result Value Ref Range Status   MRSA by PCR NEGATIVE NEGATIVE Final    Comment:        The GeneXpert MRSA Assay (FDA approved for NASAL specimens only), is one component of a comprehensive MRSA colonization surveillance program. It is not intended to diagnose MRSA infection nor to guide or monitor treatment for MRSA infections.     Procedure: After being the patient and obtaining verbal consent the patient suprapubic region was prepped with Betadine. I then passed a 0.038 sensor wire through the hole and into the bladder. I then attempted to pass an 83 [place] council tip over the wire and into the bladder unsuccessfully, the patient did not tolerate this part of the procedure very well. I then removed  the 73 [place] council tip and passed an 8 [place] feeding tube over the wire down into the bladder removing the wire which confirmed that I was truly in the patient's bladder. I replaced the wire and with an 18-gauge needle was able to get the wire through a 16 [place] Silastic Foley catheter. I then with gentle pressure was able to get this catheter into the patient's bladder. 10 mL of sterile water was inflated the balloon. The wire was removed. Turbid possible urine returned from the catheter.  Imaging: none  Imp: The patient has a history of urinary retention from presumed old severe urethral stricture. Given the difficulty with catheterization suprapubic tube was passed. He was due to be changed this week and in the process for skilled nursing facility lost access I was eventually able to pass a 16 French silicone catheter into the patient's bladder over a wire.  Recommendations:  The patient can return to the skilled nursing facility. He should be started on empiric antibiotics and a urine culture sent. The catheter should then be replaced in 1 month. The patient can follow up with his primary urologist, Dr. Jacqlyn Larsen.  Thank you for involving me in this  patient's care, Please page with any further questions or concerns.  Louis Meckel W

## 2016-05-29 NOTE — ED Notes (Signed)
MD attempted multiple times to re-inset suprapubic catheter without success. RN and paramedic attempted catheter via penis multiple times without success. Pt verbalized pain in lower abd and penis after attempts. MD made aware of failed attempts. Bladder scan showed no urine retention at this time.

## 2016-05-29 NOTE — Discharge Instructions (Signed)
Please follow up with with urology.  Return for worsening symptoms or concerns.

## 2016-05-30 NOTE — ED Notes (Signed)
EMS transport arrived for pt

## 2016-06-18 ENCOUNTER — Other Ambulatory Visit
Admission: RE | Admit: 2016-06-18 | Discharge: 2016-06-18 | Disposition: A | Discharge status: Home / Self Care | Payer: Medicare Other | Source: Ambulatory Visit | Attending: Family Medicine | Admitting: Family Medicine

## 2016-06-18 DIAGNOSIS — R300 Dysuria: Secondary | ICD-10-CM | POA: Diagnosis present

## 2016-06-18 LAB — URINALYSIS, COMPLETE (UACMP) WITH MICROSCOPIC
Bacteria, UA: NONE SEEN
Bilirubin Urine: UNDETERMINED
Glucose, UA: UNDETERMINED mg/dL
Hgb urine dipstick: UNDETERMINED
KETONES UR: UNDETERMINED mg/dL
Leukocytes, UA: UNDETERMINED
Nitrite: UNDETERMINED
PH: UNDETERMINED (ref 5.0–8.0)
Protein, ur: UNDETERMINED mg/dL
SPECIFIC GRAVITY, URINE: UNDETERMINED (ref 1.005–1.030)
SQUAMOUS EPITHELIAL / LPF: NONE SEEN

## 2016-06-20 LAB — URINE CULTURE

## 2016-08-01 ENCOUNTER — Emergency Department
Admission: EM | Admit: 2016-08-01 | Discharge: 2016-08-02 | Disposition: A | Discharge status: Home / Self Care | Payer: Medicare Other | Attending: Emergency Medicine | Admitting: Emergency Medicine

## 2016-08-01 ENCOUNTER — Emergency Department: Payer: Medicare Other

## 2016-08-01 DIAGNOSIS — M899 Disorder of bone, unspecified: Secondary | ICD-10-CM | POA: Diagnosis not present

## 2016-08-01 DIAGNOSIS — Z87891 Personal history of nicotine dependence: Secondary | ICD-10-CM | POA: Insufficient documentation

## 2016-08-01 DIAGNOSIS — I10 Essential (primary) hypertension: Secondary | ICD-10-CM | POA: Diagnosis not present

## 2016-08-01 DIAGNOSIS — Z79899 Other long term (current) drug therapy: Secondary | ICD-10-CM | POA: Diagnosis not present

## 2016-08-01 DIAGNOSIS — R0600 Dyspnea, unspecified: Secondary | ICD-10-CM | POA: Diagnosis not present

## 2016-08-01 DIAGNOSIS — R079 Chest pain, unspecified: Secondary | ICD-10-CM

## 2016-08-01 DIAGNOSIS — I2510 Atherosclerotic heart disease of native coronary artery without angina pectoris: Secondary | ICD-10-CM | POA: Diagnosis not present

## 2016-08-01 DIAGNOSIS — R05 Cough: Secondary | ICD-10-CM | POA: Insufficient documentation

## 2016-08-01 DIAGNOSIS — J449 Chronic obstructive pulmonary disease, unspecified: Secondary | ICD-10-CM | POA: Diagnosis not present

## 2016-08-01 DIAGNOSIS — Z7409 Other reduced mobility: Secondary | ICD-10-CM | POA: Diagnosis not present

## 2016-08-01 LAB — CBC
HEMATOCRIT: 33 % — AB (ref 40.0–52.0)
HEMOGLOBIN: 11.2 g/dL — AB (ref 13.0–18.0)
MCH: 29.5 pg (ref 26.0–34.0)
MCHC: 34 g/dL (ref 32.0–36.0)
MCV: 86.8 fL (ref 80.0–100.0)
Platelets: 309 10*3/uL (ref 150–440)
RBC: 3.81 MIL/uL — ABNORMAL LOW (ref 4.40–5.90)
RDW: 16.7 % — AB (ref 11.5–14.5)
WBC: 7.7 10*3/uL (ref 3.8–10.6)

## 2016-08-01 NOTE — ED Provider Notes (Signed)
[facility] Emergency Department Provider Note   First MD Initiated Contact with Patient 08/01/16 2320     (approximate)  I have reviewed the triage vital signs and the nursing notes.   HISTORY  Chief Complaint Chest Pain and Cough    HPI Brent Ryan is a 76 y.o. male with below list of chronic medical conditions presents the emergency department via EMS with 2 day history of sharp 9 of 10 intermittent right-sided chest discomfort associated with dyspnea. Patient denies any chest pain at present Patient states episodes are brief lasting approximately seconds however with rapid succession. Patient states that he has had 3 sublingual nitroglycerin denies any pain at present. Patient also admits to dyspnea. Patient denies any orthopnea denies any lower extremity pain or swelling. Patient denies any known history of DVT or PE.   Past Medical History:  Diagnosis Date  . Chronic pain   . Contracture of left knee   . COPD (chronic obstructive pulmonary disease) ([place])   . Coronary artery disease   . Depressed   . Dysphagia   . Hypertension   . S/P AKA (above knee amputation) [facility])     Patient Active Problem List   Diagnosis Date Noted  . Incomplete bladder emptying 10/21/2014  . Angiodysplasia of stomach   . Gastric polyp   . Hematemesis 09/28/2014  . Acute urinary retention 09/27/2014  . Urethral stricture 09/27/2014  . Cancer of kidney ([place]) 05/15/2012  . Calculus of kidney 04/18/2012  . Benign prostatic hyperplasia with urinary obstruction 04/18/2012  . Acquired cyst of kidney 04/18/2012    Past Surgical History:  Procedure Laterality Date  . AMPUTATION    . ESOPHAGOGASTRODUODENOSCOPY N/A 09/29/2014   Procedure: ESOPHAGOGASTRODUODENOSCOPY (EGD);  Surgeon: Lucilla Lame, MD;  Location: [facility] ENDOSCOPY;  Service: Endoscopy;  Laterality: N/A;  . RENAL BIOPSY, PERCUTANEOUS      Prior to Admission medications   Medication Sig Start Date End Date  Taking? Authorizing Provider  acetaminophen (TYLENOL) 325 MG tablet Take 650 mg by mouth every 6 (six) hours as needed for mild pain or fever.    [provider]  acetaminophen (TYLENOL) 500 MG tablet Take 1,000 mg by mouth 2 (two) times daily.    [provider]  albuterol (PROVENTIL HFA;VENTOLIN HFA) 108 (90 BASE) MCG/ACT inhaler Inhale 2 puffs into the lungs 4 (four) times daily.    [provider]  aspirin EC 325 MG tablet Take 1 tablet (325 mg total) by mouth daily. 12/29/14   Joanne Gavel, MD  cloNIDine (CATAPRES) 0.1 MG tablet Take 0.1 mg by mouth once.    [provider]  escitalopram (LEXAPRO) 10 MG tablet Take 10 mg by mouth daily.    [provider]  fluticasone (FLOVENT HFA) 44 MCG/ACT inhaler Inhale 1 puff into the lungs 2 (two) times daily.    [provider]  Hypromellose (ARTIFICIAL TEARS OP) Apply 1 drop to eye 3 (three) times daily. Pt uses at 6am, 2pm, and 10pm.    [provider]  ipratropium-albuterol (DUONEB) 0.5-2.5 (3) MG/3ML SOLN Take 3 mLs by nebulization every 6 (six) hours as needed (for wheezing/shortness of breath).    [provider]  methimazole (TAPAZOLE) 5 MG tablet Take 7.5 mg by mouth daily.    [provider]  metoprolol tartrate (LOPRESSOR) 25 MG tablet Take 25 mg by mouth 2 (two) times daily.  04/18/12   [provider]  ondansetron (ZOFRAN) 4 MG tablet Take 4 mg  by mouth every 6 (six) hours as needed for nausea or vomiting.    [provider]  pantoprazole (PROTONIX) 40 MG tablet Take 1 tablet (40 mg total) by mouth 2 (two) times daily. Patient taking differently: Take 40 mg by mouth daily.  09/30/14   Max Sane, MD  polyethylene glycol (MIRALAX / GLYCOLAX) packet Take 17 g by mouth 2 (two) times daily.    [provider]  tamsulosin (FLOMAX) 0.4 MG CAPS capsule Take 0.4 mg by mouth daily.    [provider]  tiotropium (SPIRIVA) 18 MCG  inhalation capsule Place 18 mcg into inhaler and inhale daily.    [provider]  traMADol (ULTRAM) 50 MG tablet Take 50 mg by mouth 2 (two) times daily.    [provider]  Vitamin D, Ergocalciferol, (DRISDOL) 50000 UNITS CAPS capsule Take 50,000 Units by mouth every 30 (thirty) days.    [provider]    Allergies Patient has no known allergies.  No family history on file.  Social History Social History  Substance Use Topics  . Smoking status: Former Research scientist (life sciences)  . Smokeless tobacco: Never Used  . Alcohol use No    Review of Systems Constitutional: No fever/chills Eyes: No visual changes. ENT: No sore throat. Cardiovascular: Positive for chest pain. Respiratory: Positive for shortness of breath. Gastrointestinal: No abdominal pain.  No nausea, no vomiting.  No diarrhea.  No constipation. Genitourinary: Negative for dysuria. Musculoskeletal: Negative for neck pain.  Negative for back pain. Integumentary: Negative for rash. Neurological: Negative for headaches, focal weakness or numbness.  ____________________________________________   PHYSICAL EXAM:  VITAL SIGNS: ED Triage Vitals  Enc Vitals Group     BP 08/01/16 2326 (!) 134/94     Pulse Rate 08/01/16 2326 75     Resp 08/01/16 2326 (!) 22     Temp 08/01/16 2326 98.9 F (37.2 C)     Temp Source 08/01/16 2326 Oral     SpO2 08/01/16 2326 95 %     Weight 08/01/16 2328 81.8 kg (180 lb 5.4 oz)     Height 08/01/16 2328 1.778 m (5\' 10" )     Head Circumference --      Peak Flow --      Pain Score 08/01/16 2325 7     Pain Loc --      Pain Edu? --      Excl. in [address] [place]? --     Constitutional: Alert and oriented. Well appearing and in no acute distress. Eyes: Conjunctivae are normal.  Head: Atraumatic. Mouth/Throat: Mucous membranes are moist.  Oropharynx non-erythematous. Neck: No stridor.   Cardiovascular: Normal rate, regular rhythm. Good peripheral circulation. Grossly normal heart  sounds. Respiratory: Normal respiratory effort.  No retractions. Lungs CTAB. Gastrointestinal: Soft and nontender. No distention.  Musculoskeletal: No lower extremity tenderness nor edema. No gross deformities of extremities. Neurologic:  Normal speech and language. No gross focal neurologic deficits are appreciated.  Skin:  Skin is warm, dry and intact. No rash noted. Psychiatric: Mood and affect are normal. Speech and behavior are normal.  ____________________________________________   LABS (all labs ordered are listed, but only abnormal results are displayed)  Labs Reviewed  BASIC METABOLIC PANEL - Abnormal; Notable for the following:       Result Value   Chloride 99 (*)    Glucose, Bld 139 (*)    Calcium 8.8 (*)    All other components within normal limits  CBC - Abnormal; Notable for the following:  RBC 3.81 (*)    Hemoglobin 11.2 (*)    HCT 33.0 (*)    RDW 16.7 (*)    All other components within normal limits  FIBRIN DERIVATIVES D-DIMER ([facility] ONLY) - Abnormal; Notable for the following:    Fibrin derivatives D-dimer ([facility]) 3,531.50 (*)    All other components within normal limits  TROPONIN I  TROPONIN I   ____________________________________________  EKG  ED ECG REPORT I, Philo N Maciah Schweigert, the attending physician, personally viewed and interpreted this ECG.   Date: 08/02/2016  EKG Time: 11:25 PM  Rate: 70  Rhythm: Normal sinus rhythm  Axis: Normal  Intervals: Normal  ST&T Change: None  ____________________________________________  RADIOLOGY I, [place] N Surafel Hilleary, personally viewed and evaluated these images (plain radiographs) as part of my medical decision making, as well as reviewing the written report by the radiologist.  Ct Angio Chest Pe W Or Wo Contrast  Result Date: 08/02/2016 CLINICAL DATA:  Chest pain for 2 days. Decreased urine output for 2 days. History of COPD, renal cancer. EXAM: CT ANGIOGRAPHY CHEST WITH CONTRAST TECHNIQUE: Multidetector CT  imaging of the chest was performed using the standard protocol during bolus administration of intravenous contrast. Multiplanar CT image reconstructions and MIPs were obtained to evaluate the vascular anatomy. CONTRAST:  75 cc Isovue 370 COMPARISON:  Chest radiograph Aug 01, 2016 at 2345 hours FINDINGS: Mild motion degraded examination. CARDIOVASCULAR: Adequate contrast opacification of the pulmonary artery's. Main pulmonary artery is not enlarged. No pulmonary arterial filling defects to the level of the subsegmental branches. Heart size is normal, no right heart strain. Severe coronary artery calcifications. Small pericardial effusion. Thoracic aorta is normal course and caliber, moderate calcific atherosclerosis. MEDIASTINUM/NODES: No lymphadenopathy by CT size criteria. Thyromegaly with LEFT substernal extent, mildly deforming the trachea. LUNGS/PLEURA: Tracheobronchial tree is patent, no pneumothorax. Dependent atelectasis. Patchy subpleural bibasilar consolidation. UPPER ABDOMEN: Small to moderate hiatal hernia. MUSCULOSKELETAL: Visualized soft tissues and included osseous structures are nonacute. Old LEFT humerus fracture. Old LEFT posterior rib fractures. Sclerotic RIGHT lateral fifth and sixth ribs. Subcentimeter sclerotic focus T7 vertebral body and, RIGHT T7 lamina. Review of the MIP images confirms the above findings. IMPRESSION: No acute pulmonary embolism on this motion degraded examination. Bilateral symmetric subpleural consolidation favoring atelectasis, less likely pneumonia. Sclerotic LEFT lateral fifth and sixth ribs and an sclerotic lesions T7 concerning for metastatic disease. Recommend bone scan on nonemergent basis. Thyromegaly accounting for radiographic abnormality. Electronically Signed   By: Elon Alas M.D.   On: 08/02/2016 01:50   Dg Chest Port 1 View  Result Date: 08/02/2016 CLINICAL DATA:  76 year old male with chest pain. EXAM: PORTABLE CHEST 1 VIEW COMPARISON:  Chest  radiograph dated 02/23/2014 FINDINGS: Mild diffuse chronic interstitial coarsening. The lungs are clear. No pleural effusion or pneumothorax. Apparent mild deviation of the upper trachea to the right may be related to an enlarged aortic arch or a left thyroid nodule. This finding is similar to the prior radiograph. CT of the chest may provide better evaluation if clinically indicated. There is mild cardiomegaly. Atherosclerotic calcification of the aortic arch and thoracic aorta. There is osteopenia with degenerative changes of the spine. No acute fracture. IMPRESSION: 1. No acute intrathoracic pathology. 2. Mild cardiomegaly. 3. Stable indentation of the left trachea may be related to mass effect caused by left thyroid nodule or dilated aortic arch. CT of the chest may provide better evaluation if clinically indicated. Electronically Signed   By: Anner Crete M.D.   On: 08/02/2016  00:11      Procedures   ____________________________________________   INITIAL IMPRESSION / ASSESSMENT AND PLAN / ED COURSE  Pertinent labs & imaging results that were available during my care of the patient were reviewed by me and considered in my medical decision making (see chart for details).  EKG no evidence of ischemia or infarction. Troponin negative 2 patient has no chest pain at this time and has had no chest pain while in the emergency department. As the patient lives regarding sclerotic lesions were noted on his CAT scan.      ____________________________________________  FINAL CLINICAL IMPRESSION(S) / ED DIAGNOSES  Final diagnoses:  Dyspnea  Immobility  Chest pain  Chest pain, unspecified type  Lytic bone lesions on xray     MEDICATIONS GIVEN DURING THIS VISIT:  Medications  iopamidol (ISOVUE-370) 76 % injection 75 mL (75 mLs Intravenous Contrast Given 08/02/16 0058)     NEW OUTPATIENT MEDICATIONS STARTED DURING THIS VISIT:  New Prescriptions   No medications on file     Modified Medications   No medications on file    Discontinued Medications   No medications on file     Note:  This document was prepared using Dragon voice recognition software and may include unintentional dictation errors.    Gregor Hams, MD 08/02/16 [phone]

## 2016-08-01 NOTE — ED Triage Notes (Signed)
Pt arrives to ED from [facility] with c/o CP x2 days. EMS reports 3 doses of SL Nitro given at the facility prior to transport. EMS also reports that the facility mentioned a decrease in urine output over the last day or two. EMS also reports pt is on chronic 3L O2 via [place]. Pt reports centralized chest pain, SHOB, and cough "since 10th grade". Dr Owens Shark at bedside upon pt's arrival and reports wheezes auscultated in lung sounds upon EDP assessment.

## 2016-08-02 ENCOUNTER — Emergency Department: Payer: Medicare Other

## 2016-08-02 ENCOUNTER — Encounter: Payer: Self-pay | Admitting: Radiology

## 2016-08-02 DIAGNOSIS — R0600 Dyspnea, unspecified: Secondary | ICD-10-CM | POA: Diagnosis not present

## 2016-08-02 LAB — BASIC METABOLIC PANEL
Anion gap: 8 (ref 5–15)
BUN: 19 mg/dL (ref 6–20)
CALCIUM: 8.8 mg/dL — AB (ref 8.9–10.3)
CO2: 29 mmol/L (ref 22–32)
CREATININE: 0.96 mg/dL (ref 0.61–1.24)
Chloride: 99 mmol/L — ABNORMAL LOW (ref 101–111)
GFR calc Af Amer: >60 mL/min (ref >60)
GLUCOSE: 139 mg/dL — AB (ref 65–99)
Potassium: 4.1 mmol/L (ref 3.5–5.1)
Sodium: 136 mmol/L (ref 135–145)

## 2016-08-02 LAB — TROPONIN I
Troponin I: <0.03 ng/mL (ref <0.03)
Troponin I: <0.03 ng/mL (ref <0.03)

## 2016-08-02 LAB — FIBRIN DERIVATIVES D-DIMER (ARMC ONLY): Fibrin derivatives D-dimer (ARMC): 3531.5 — ABNORMAL HIGH (ref 0.00–499.00)

## 2016-08-02 MED ORDER — LIDOCAINE 5 % EX PTCH: 1.0000 | MEDICATED_PATCH | CUTANEOUS | Status: DC

## 2016-08-02 MED ORDER — IOPAMIDOL (ISOVUE-370) INJECTION 76%
75.0000 mL | Freq: Once | INTRAVENOUS | Status: AC | PRN
  Administered 2016-08-02: 75 mL via INTRAVENOUS

## 2016-08-02 MED ORDER — OXYCODONE-ACETAMINOPHEN 5-325 MG PO TABS: 1.0000 | ORAL_TABLET | Freq: Once | ORAL | Status: DC

## 2016-08-02 NOTE — Discharge Instructions (Signed)
Multiple bone lesions were noted today on your CAT scan of her chest and the left lateral fifth and sixth ribs as well as the seventh vertebrae. Please follow-up with Dr. Caryn Section for further outpatient evaluation.

## 2017-12-28 ENCOUNTER — Other Ambulatory Visit
Admission: RE | Admit: 2017-12-28 | Discharge: 2017-12-28 | Disposition: A | Discharge status: Home / Self Care | Payer: Medicare Other | Source: Ambulatory Visit | Attending: Family Medicine | Admitting: Family Medicine

## 2017-12-28 DIAGNOSIS — R4182 Altered mental status, unspecified: Secondary | ICD-10-CM | POA: Insufficient documentation

## 2017-12-28 LAB — URINALYSIS, COMPLETE (UACMP) WITH MICROSCOPIC
BILIRUBIN URINE: NEGATIVE
Glucose, UA: NEGATIVE mg/dL
Ketones, ur: NEGATIVE mg/dL
Nitrite: NEGATIVE
PH: 7 (ref 5.0–8.0)
Protein, ur: 30 mg/dL — AB
SPECIFIC GRAVITY, URINE: 1.016 (ref 1.005–1.030)
Squamous Epithelial / LPF: NONE SEEN (ref 0–5)
WBC, UA: >50 WBC/hpf — ABNORMAL HIGH (ref 0–5)

## 2017-12-30 LAB — URINE CULTURE

## 2018-06-04 DEATH — deceased
